# Patient Record
Sex: Female | Born: 1980 | Race: White | Hispanic: No | Marital: Single | Smoking: Former smoker
Health system: Southern US, Community
[De-identification: ages and names within clinical notes are randomized; demographics above are authoritative.]

## PROBLEM LIST (undated history)

## (undated) DIAGNOSIS — F909 Attention-deficit hyperactivity disorder, unspecified type: Secondary | ICD-10-CM

## (undated) DIAGNOSIS — F3181 Bipolar II disorder: Secondary | ICD-10-CM

## (undated) DIAGNOSIS — F419 Anxiety disorder, unspecified: Secondary | ICD-10-CM

## (undated) DIAGNOSIS — F4010 Social phobia, unspecified: Secondary | ICD-10-CM

## (undated) DIAGNOSIS — F32A Depression, unspecified: Secondary | ICD-10-CM

## (undated) DIAGNOSIS — F4310 Post-traumatic stress disorder, unspecified: Secondary | ICD-10-CM

## (undated) DIAGNOSIS — T7840XA Allergy, unspecified, initial encounter: Secondary | ICD-10-CM

## (undated) HISTORY — DX: Anxiety disorder, unspecified: F41.9

## (undated) HISTORY — DX: Allergy, unspecified, initial encounter: T78.40XA

---
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
## 2002-03-09 ENCOUNTER — Other Ambulatory Visit: Admission: RE | Admit: 2002-03-09 | Discharge: 2002-03-09 | Payer: Self-pay | Admitting: Family Medicine

## 2004-08-29 ENCOUNTER — Ambulatory Visit: Payer: Self-pay | Admitting: Family Medicine

## 2005-12-25 ENCOUNTER — Other Ambulatory Visit: Admission: RE | Admit: 2005-12-25 | Discharge: 2005-12-25 | Payer: Self-pay | Admitting: Obstetrics and Gynecology

## 2006-06-23 ENCOUNTER — Inpatient Hospital Stay (HOSPITAL_COMMUNITY): Admission: AD | Admit: 2006-06-23 | Discharge: 2006-06-26 | Payer: Self-pay | Admitting: Obstetrics and Gynecology

## 2006-08-11 ENCOUNTER — Other Ambulatory Visit: Admission: RE | Admit: 2006-08-11 | Discharge: 2006-08-11 | Payer: Self-pay | Admitting: Obstetrics and Gynecology

## 2010-07-24 NOTE — Discharge Summary (Signed)
NAMEDAMIAN, HOFSTRA                 ACCOUNT NO.:  [id number]   MEDICAL RECORD NO.:  [id number]          PATIENT TYPE:  INP   LOCATION:  9147                          FACILITY:  [facility]   PHYSICIAN:  James A. Ashley Royalty, M.D.DATE OF BIRTH:  04-25-1980   DATE OF ADMISSION:  06/23/2006  DATE OF DISCHARGE:  06/26/2006                               DISCHARGE SUMMARY   DISCHARGE DIAGNOSES:  1. Intrauterine pregnancy at 52 weeks' gestation, delivered.  2. O negative blood type.  3. Term birth living child, vertex.   OPERATIONS AND PROCEDURES:  OB delivery with episiotomy, episiorrhaphy.   CONSULTATIONS:  None.   DISCHARGE MEDICATIONS:  1. Percocet.  2. Motrin 600 mg.   HISTORY AND PHYSICAL:  This is a 30 year old primigravida 56 weeks'  gestation with O negative blood type.  The father of the baby has a  limb/girdle muscular dystrophy.  The patient was evaluated at [facility].  She presented to the office on the day of admission for  routine checkup and was complaining of contractions.  She was noted be 3-  4 cm dilated in the office, 90% effaced, -1 station, vertex  presentation.  She was transferred to maternity admissions.  For the  remainder of the history and physical, please see chart.   HOSPITAL COURSE:  The patient was admitted to [facility].  Admission laboratory studies were drawn.  She went on to  labor and deliver on June 24, 2006.  The infant was an 8-pound 4-ounces  female, Apgars 9 at one minute and 9 at five minutes, sent to newborn  nursery.  Delivery was accomplished by Dr. Sylvester Harder over a second-  degree midline episiotomy which was repaired without difficulty.  The  patient was discharged home on the second postpartum day, afebrile and  in satisfactory condition.   DISPOSITION:  The patient is to return to [facility] in 4-6 weeks for postpartum evaluation.      James A. Ashley Royalty, M.D.  Electronically  Signed     JAM/MEDQ  D:  08/06/2006  T:  08/06/2006  Job:  [id number]

## 2012-06-22 ENCOUNTER — Ambulatory Visit (INDEPENDENT_AMBULATORY_CARE_PROVIDER_SITE_OTHER): Payer: BC Managed Care – PPO | Admitting: Emergency Medicine

## 2012-06-22 VITALS — BP 130/91 | HR 88 | Temp 98.8°F | Resp 16 | Ht 69.25 in | Wt 167.0 lb

## 2012-06-22 DIAGNOSIS — S335XXA Sprain of ligaments of lumbar spine, initial encounter: Secondary | ICD-10-CM

## 2012-06-22 MED ORDER — NAPROXEN SODIUM 550 MG PO TABS: 550.0000 mg | ORAL_TABLET | Freq: Two times a day (BID) | ORAL | Status: AC

## 2012-06-22 MED ORDER — HYDROCODONE-ACETAMINOPHEN 5-325 MG PO TABS: 1.0000 | ORAL_TABLET | ORAL | Status: DC | PRN

## 2012-06-22 MED ORDER — CYCLOBENZAPRINE HCL 10 MG PO TABS: 10.0000 mg | ORAL_TABLET | Freq: Three times a day (TID) | ORAL | Status: DC | PRN

## 2012-06-22 NOTE — Progress Notes (Signed)
[facility] [address], [place] [address] [phone]  Date:  06/22/2012   Name:  Kristy Petersen   DOB:  [birth year]   MRN:  [id number]  PCP:  No primary provider on file.    Chief Complaint: Back Pain   History of Present Illness:  Kristy Petersen is a 32 y.o. very pleasant female patient who presents with the following:  Lifted her wheelchair bound spouse into a chair Saturday and developed pain in the right low back.  Non radiating. No neuro symptoms or weakness.  No history of prior injury.  No improvement with over the counter medications or other home remedies.  Denies other complaint or health concern today.   There is no problem list on file for this patient.   Past Medical History  Diagnosis Date  . Allergy   . Anxiety     History reviewed. No pertinent past surgical history.  History  Substance Use Topics  . Smoking status: Never Smoker   . Smokeless tobacco: Never Used  . Alcohol Use: No    Family History  Problem Relation Age of Onset  . Lupus Mother     No Known Allergies  Medication list has been reviewed and updated.  No current outpatient prescriptions on file prior to visit.   No current facility-administered medications on file prior to visit.    Review of Systems:  As per HPI, otherwise negative.    Physical Examination: Filed Vitals:   06/22/12 1353  BP: 130/91  Pulse: 88  Temp: 98.8 F (37.1 C)  Resp: 16   Filed Vitals:   06/22/12 1353  Height: 5' 9.25" (1.759 m)  Weight: 167 lb (75.751 kg)   Body mass index is 24.48 kg/(m^2). Ideal Body Weight: Weight in (lb) to have BMI = 25: 170.2   GEN: WDWN, NAD, Non-toxic, Alert & Oriented x 3 HEENT: Atraumatic, Normocephalic.  Ears and Nose: No external deformity. EXTR: No clubbing/cyanosis/edema NEURO: Normal gait.  PSYCH: Normally interactive. Conversant. Not depressed or anxious appearing.  Calm demeanor.  BACK:  Tender right para spinous muscles.   Motor and sensory intact   Assessment and Plan:  Lumbar strain Flexeril Anaprox vicodin Local heat Follow up in one week Signed,  Phillips Odor, MD

## 2012-06-22 NOTE — Patient Instructions (Addendum)

## 2013-11-07 ENCOUNTER — Ambulatory Visit (INDEPENDENT_AMBULATORY_CARE_PROVIDER_SITE_OTHER): Payer: BC Managed Care – PPO | Admitting: Family Medicine

## 2013-11-07 VITALS — BP 122/64 | HR 86 | Temp 97.9°F | Resp 14 | Ht 69.5 in | Wt 143.0 lb

## 2013-11-07 DIAGNOSIS — S335XXA Sprain of ligaments of lumbar spine, initial encounter: Secondary | ICD-10-CM

## 2013-11-07 MED ORDER — PREDNISONE 20 MG PO TABS: 40.0000 mg | ORAL_TABLET | Freq: Every day | ORAL | Status: DC

## 2013-11-07 MED ORDER — HYDROCODONE-ACETAMINOPHEN 5-325 MG PO TABS: 1.0000 | ORAL_TABLET | ORAL | Status: DC | PRN

## 2013-11-07 NOTE — Progress Notes (Signed)
° °  Subjective:  This chart was scribed for Elvina Sidle, MD by Arlan Organ, [facility] Scribe. This patient was seen in room 3 and the patient's care was started 12:11 PM.    Patient ID: Kristy Petersen, female    DOB: 09-Jul-1980, 33 y.o.   MRN: [id number]  Chief Complaint  Patient presents with   Back Pain    since yesterday, transferring husband from chair to bed    HPI HPI Comments: Kristy Petersen is a 33 y.o. female who presents to [facility] complaining of back pain onset yesterday. Pt states she was pivoting her husband to the bed from his power wheelchair when she pulled her back. Pain is exacerbated with certain movements. No alleviating factors at this time. She has tried ice/heat application along with OTC medications without any improvement for symptoms. At this time she denies any fever or chills. No numbness, paresthesia, or loss of sensation. She denies any urinary or bowel incontinence. No known allergies to medications. No other concerns this visit.  There are no active problems to display for this patient.  Past Medical History  Diagnosis Date   Allergy    Anxiety    History reviewed. No pertinent past surgical history. No Known Allergies Prior to Admission medications   Medication Sig Start Date End Date Taking? Authorizing Provider  cyclobenzaprine (FLEXERIL) 10 MG tablet Take 1 tablet (10 mg total) by mouth 3 (three) times daily as needed for muscle spasms. 06/22/12   Carmelina Dane, MD  HYDROcodone-acetaminophen (NORCO) 5-325 MG per tablet Take 1 tablet by mouth every 4 (four) hours as needed. 06/22/12   Carmelina Dane, MD    Review of Systems  Constitutional: Negative for fever and chills.  Musculoskeletal: Positive for back pain.  Neurological: Negative for weakness and numbness.    Triage Vitals: BP 122/64   Pulse 86   Temp(Src) 97.9 F (36.6 C) (Oral)   Resp 14   Ht 5' 9.5" (1.765 m)   Wt 143 lb (64.864 kg)   BMI  20.82 kg/m2   SpO2 99%   LMP 10/25/2013  Objective:   Physical Exam  Nursing note and vitals reviewed. Constitutional: She is oriented to person, place, and time. She appears well-developed and well-nourished.  HENT:  Head: Normocephalic.  Eyes: EOM are normal.  Neck: Normal range of motion.  Pulmonary/Chest: Effort normal.  Abdominal: She exhibits no distension.  Musculoskeletal: Normal range of motion.  Neurological: She is alert and oriented to person, place, and time.  Psychiatric: She has a normal mood and affect.    Assessment & Plan:  I personally performed the services described in this documentation, which was scribed in my presence. The recorded information has been reviewed and is accurate.    Lumbar back sprain, initial encounter - Plan: HYDROcodone-acetaminophen (NORCO) 5-325 MG per tablet, predniSONE (DELTASONE) 20 MG tablet  Signed, Elvina Sidle, MD

## 2013-11-07 NOTE — Patient Instructions (Signed)
I believe you have a strained back muscle which should gradually resolve over the next few days.  If the pain worsens, or fails to improve in 48 hours, please return

## 2013-11-18 ENCOUNTER — Ambulatory Visit (INDEPENDENT_AMBULATORY_CARE_PROVIDER_SITE_OTHER): Payer: BC Managed Care – PPO

## 2013-11-18 ENCOUNTER — Ambulatory Visit (INDEPENDENT_AMBULATORY_CARE_PROVIDER_SITE_OTHER): Payer: BC Managed Care – PPO | Admitting: Family Medicine

## 2013-11-18 VITALS — BP 122/68 | HR 104 | Temp 98.7°F | Resp 18 | Ht 68.25 in | Wt 141.0 lb

## 2013-11-18 DIAGNOSIS — M545 Low back pain, unspecified: Secondary | ICD-10-CM

## 2013-11-18 DIAGNOSIS — S335XXA Sprain of ligaments of lumbar spine, initial encounter: Secondary | ICD-10-CM

## 2013-11-18 MED ORDER — CYCLOBENZAPRINE HCL 10 MG PO TABS: 10.0000 mg | ORAL_TABLET | Freq: Three times a day (TID) | ORAL | Status: DC | PRN

## 2013-11-18 MED ORDER — MELOXICAM 15 MG PO TABS: 15.0000 mg | ORAL_TABLET | Freq: Every day | ORAL | Status: DC

## 2013-11-18 MED ORDER — HYDROCODONE-ACETAMINOPHEN 5-325 MG PO TABS: 1.0000 | ORAL_TABLET | ORAL | Status: DC | PRN

## 2013-11-18 NOTE — Patient Instructions (Signed)
Lumbosacral Strain Lumbosacral strain is a strain of any of the parts that make up your lumbosacral vertebrae. Your lumbosacral vertebrae are the bones that make up the lower third of your backbone. Your lumbosacral vertebrae are held together by muscles and tough, fibrous tissue (ligaments).  CAUSES  A sudden blow to your back can cause lumbosacral strain. Also, anything that causes an excessive stretch of the muscles in the low back can cause this strain. This is typically seen when people exert themselves strenuously, fall, lift heavy objects, bend, or crouch repeatedly. RISK FACTORS  Physically demanding work.  Participation in pushing or pulling sports or sports that require a sudden twist of the back (tennis, golf, baseball).  Weight lifting.  Excessive lower back curvature.  Forward-tilted pelvis.  Weak back or abdominal muscles or both.  Tight hamstrings. SIGNS AND SYMPTOMS  Lumbosacral strain may cause pain in the area of your injury or pain that moves (radiates) down your leg.  DIAGNOSIS Your health care provider can often diagnose lumbosacral strain through a physical exam. In some cases, you may need tests such as X-ray exams.  TREATMENT  Treatment for your lower back injury depends on many factors that your clinician will have to evaluate. However, most treatment will include the use of anti-inflammatory medicines. HOME CARE INSTRUCTIONS   Avoid hard physical activities (tennis, racquetball, waterskiing) if you are not in proper physical condition for it. This may aggravate or create problems.  If you have a back problem, avoid sports requiring sudden body movements. Swimming and walking are generally safer activities.  Maintain good posture.  Maintain a healthy weight.  For acute conditions, you may put ice on the injured area.  Put ice in a plastic bag.  Place a towel between your skin and the bag.  Leave the ice on for 20 minutes, 2-3 times a day.  When the  low back starts healing, stretching and strengthening exercises may be recommended. SEEK MEDICAL CARE IF:  Your back pain is getting worse.  You experience severe back pain not relieved with medicines. SEEK IMMEDIATE MEDICAL CARE IF:   You have numbness, tingling, weakness, or problems with the use of your arms or legs.  There is a change in bowel or bladder control.  You have increasing pain in any area of the body, including your belly (abdomen).  You notice shortness of breath, dizziness, or feel faint.  You feel sick to your stomach (nauseous), are throwing up (vomiting), or become sweaty.  You notice discoloration of your toes or legs, or your feet get very cold. MAKE SURE YOU:   Understand these instructions.  Will watch your condition.  Will get help right away if you are not doing well or get worse. Document Released: 12/05/2004 Document Revised: 03/02/2013 Document Reviewed: 10/14/2012 [facility] Patient Information 2015 [place], [place]. This information is not intended to replace advice given to you by your health care provider. Make sure you discuss any questions you have with your health care provider. Low Back Strain with Rehab A strain is an injury in which a tendon or muscle is torn. The muscles and tendons of the lower back are vulnerable to strains. However, these muscles and tendons are very strong and require a great force to be injured. Strains are classified into three categories. Grade 1 strains cause pain, but the tendon is not lengthened. Grade 2 strains include a lengthened ligament, due to the ligament being stretched or partially ruptured. With grade 2 strains there is still  function, although the function may be decreased. Grade 3 strains involve a complete tear of the tendon or muscle, and function is usually impaired. SYMPTOMS   Pain in the lower back.  Pain that affects one side more than the other.  Pain that gets worse with movement and may be felt  in the hip, buttocks, or back of the thigh.  Muscle spasms of the muscles in the back.  Swelling along the muscles of the back.  Loss of strength of the back muscles.  Crackling sound (crepitation) when the muscles are touched. CAUSES  Lower back strains occur when a force is placed on the muscles or tendons that is greater than they can handle. Common causes of injury include:  Prolonged overuse of the muscle-tendon units in the lower back, usually from incorrect posture.  A single violent injury or force applied to the back. RISK INCREASES WITH:  Sports that involve twisting forces on the spine or a lot of bending at the waist (football, rugby, weightlifting, bowling, golf, tennis, speed skating, racquetball, swimming, running, gymnastics, diving).  Poor strength and flexibility.  Failure to warm up properly before activity.  Family history of lower back pain or disk disorders.  Previous back injury or surgery (especially fusion).  Poor posture with lifting, especially heavy objects.  Prolonged sitting, especially with poor posture. PREVENTION   Learn and use proper posture when sitting or lifting (maintain proper posture when sitting, lift using the knees and legs, not at the waist).  Warm up and stretch properly before activity.  Allow for adequate recovery between workouts.  Maintain physical fitness:  Strength, flexibility, and endurance.  Cardiovascular fitness. PROGNOSIS  If treated properly, lower back strains usually heal within 6 weeks. RELATED COMPLICATIONS   Recurring symptoms, resulting in a chronic problem.  Chronic inflammation, scarring, and partial muscle-tendon tear.  Delayed healing or resolution of symptoms.  Prolonged disability. TREATMENT  Treatment first involves the use of ice and medicine, to reduce pain and inflammation. The use of strengthening and stretching exercises may help reduce pain with activity. These exercises may be  performed at home or with a therapist. Severe injuries may require referral to a therapist for further evaluation and treatment, such as ultrasound. Your caregiver may advise that you wear a back brace or corset, to help reduce pain and discomfort. Often, prolonged bed rest results in greater harm then benefit. Corticosteroid injections may be recommended. However, these should be reserved for the most serious cases. It is important to avoid using your back when lifting objects. At night, sleep on your back on a firm mattress with a pillow placed under your knees. If non-surgical treatment is unsuccessful, surgery may be needed.  MEDICATION   If pain medicine is needed, nonsteroidal anti-inflammatory medicines (aspirin and ibuprofen), or other minor pain relievers (acetaminophen), are often advised.  Do not take pain medicine for 7 days before surgery.  Prescription pain relievers may be given, if your caregiver thinks they are needed. Use only as directed and only as much as you need.  Ointments applied to the skin may be helpful.  Corticosteroid injections may be given by your caregiver. These injections should be reserved for the most serious cases, because they may only be given a certain number of times. HEAT AND COLD  Cold treatment (icing) should be applied for 10 to 15 minutes every 2 to 3 hours for inflammation and pain, and immediately after activity that aggravates your symptoms. Use ice packs or an ice  massage.  Heat treatment may be used before performing stretching and strengthening activities prescribed by your caregiver, physical therapist, or athletic trainer. Use a heat pack or a warm water soak. SEEK MEDICAL CARE IF:   Symptoms get worse or do not improve in 2 to 4 weeks, despite treatment.  You develop numbness, weakness, or loss of bowel or bladder function.  New, unexplained symptoms develop. (Drugs used in treatment may produce side effects.) EXERCISES  RANGE OF MOTION  (ROM) AND STRETCHING EXERCISES - Low Back Strain Most people with lower back pain will find that their symptoms get worse with excessive bending forward (flexion) or arching at the lower back (extension). The exercises which will help resolve your symptoms will focus on the opposite motion.  Your physician, physical therapist or athletic trainer will help you determine which exercises will be most helpful to resolve your lower back pain. Do not complete any exercises without first consulting with your caregiver. Discontinue any exercises which make your symptoms worse until you speak to your caregiver.  If you have pain, numbness or tingling which travels down into your buttocks, leg or foot, the goal of the therapy is for these symptoms to move closer to your back and eventually resolve. Sometimes, these leg symptoms will get better, but your lower back pain may worsen. This is typically an indication of progress in your rehabilitation. Be very alert to any changes in your symptoms and the activities in which you participated in the 24 hours prior to the change. Sharing this information with your caregiver will allow him/her to most efficiently treat your condition.  These exercises may help you when beginning to rehabilitate your injury. Your symptoms may resolve with or without further involvement from your physician, physical therapist or athletic trainer. While completing these exercises, remember:  Restoring tissue flexibility helps normal motion to return to the joints. This allows healthier, less painful movement and activity.  An effective stretch should be held for at least 30 seconds.  A stretch should never be painful. You should only feel a gentle lengthening or release in the stretched tissue. FLEXION RANGE OF MOTION AND STRETCHING EXERCISES: STRETCH - Flexion, Single Knee to Chest   Lie on a firm bed or floor with both legs extended in front of you.  Keeping one leg in contact with  the floor, bring your opposite knee to your chest. Hold your leg in place by either grabbing behind your thigh or at your knee.  Pull until you feel a gentle stretch in your lower back. Hold __________ seconds.  Slowly release your grasp and repeat the exercise with the opposite side. Repeat __________ times. Complete this exercise __________ times per day.  STRETCH - Flexion, Double Knee to Chest   Lie on a firm bed or floor with both legs extended in front of you.  Keeping one leg in contact with the floor, bring your opposite knee to your chest.  Tense your stomach muscles to support your back and then lift your other knee to your chest. Hold your legs in place by either grabbing behind your thighs or at your knees.  Pull both knees toward your chest until you feel a gentle stretch in your lower back. Hold __________ seconds.  Tense your stomach muscles and slowly return one leg at a time to the floor. Repeat __________ times. Complete this exercise __________ times per day.  STRETCH - Low Trunk Rotation  Lie on a firm bed or floor. Keeping your legs  in front of you, bend your knees so they are both pointed toward the ceiling and your feet are flat on the floor.  Extend your arms out to the side. This will stabilize your upper body by keeping your shoulders in contact with the floor.  Gently and slowly drop both knees together to one side until you feel a gentle stretch in your lower back. Hold for __________ seconds.  Tense your stomach muscles to support your lower back as you bring your knees back to the starting position. Repeat the exercise to the other side. Repeat __________ times. Complete this exercise __________ times per day  EXTENSION RANGE OF MOTION AND FLEXIBILITY EXERCISES: STRETCH - Extension, Prone on Elbows   Lie on your stomach on the floor, a bed will be too soft. Place your palms about shoulder width apart and at the height of your head.  Place your elbows  under your shoulders. If this is too painful, stack pillows under your chest.  Allow your body to relax so that your hips drop lower and make contact more completely with the floor.  Hold this position for __________ seconds.  Slowly return to lying flat on the floor. Repeat __________ times. Complete this exercise __________ times per day.  RANGE OF MOTION - Extension, Prone Press Ups  Lie on your stomach on the floor, a bed will be too soft. Place your palms about shoulder width apart and at the height of your head.  Keeping your back as relaxed as possible, slowly straighten your elbows while keeping your hips on the floor. You may adjust the placement of your hands to maximize your comfort. As you gain motion, your hands will come more underneath your shoulders.  Hold this position __________ seconds.  Slowly return to lying flat on the floor. Repeat __________ times. Complete this exercise __________ times per day.  RANGE OF MOTION- Quadruped, Neutral Spine   Assume a hands and knees position on a firm surface. Keep your hands under your shoulders and your knees under your hips. You may place padding under your knees for comfort.  Drop your head and point your tail bone toward the ground below you. This will round out your lower back like an angry cat. Hold this position for __________ seconds.  Slowly lift your head and release your tail bone so that your back sags into a large arch, like an old horse.  Hold this position for __________ seconds.  Repeat this until you feel limber in your lower back.  Now, find your "sweet spot." This will be the most comfortable position somewhere between the two previous positions. This is your neutral spine. Once you have found this position, tense your stomach muscles to support your lower back.  Hold this position for __________ seconds. Repeat __________ times. Complete this exercise __________ times per day.  STRENGTHENING EXERCISES -  Low Back Strain These exercises may help you when beginning to rehabilitate your injury. These exercises should be done near your "sweet spot." This is the neutral, low-back arch, somewhere between fully rounded and fully arched, that is your least painful position. When performed in this safe range of motion, these exercises can be used for people who have either a flexion or extension based injury. These exercises may resolve your symptoms with or without further involvement from your physician, physical therapist or athletic trainer. While completing these exercises, remember:   Muscles can gain both the endurance and the strength needed for everyday activities through controlled  exercises.  Complete these exercises as instructed by your physician, physical therapist or athletic trainer. Increase the resistance and repetitions only as guided.  You may experience muscle soreness or fatigue, but the pain or discomfort you are trying to eliminate should never worsen during these exercises. If this pain does worsen, stop and make certain you are following the directions exactly. If the pain is still present after adjustments, discontinue the exercise until you can discuss the trouble with your caregiver. STRENGTHENING - Deep Abdominals, Pelvic Tilt  Lie on a firm bed or floor. Keeping your legs in front of you, bend your knees so they are both pointed toward the ceiling and your feet are flat on the floor.  Tense your lower abdominal muscles to press your lower back into the floor. This motion will rotate your pelvis so that your tail bone is scooping upwards rather than pointing at your feet or into the floor.  With a gentle tension and even breathing, hold this position for __________ seconds. Repeat __________ times. Complete this exercise __________ times per day.  STRENGTHENING - Abdominals, Crunches   Lie on a firm bed or floor. Keeping your legs in front of you, bend your knees so they are  both pointed toward the ceiling and your feet are flat on the floor. Cross your arms over your chest.  Slightly tip your chin down without bending your neck.  Tense your abdominals and slowly lift your trunk high enough to just clear your shoulder blades. Lifting higher can put excessive stress on the lower back and does not further strengthen your abdominal muscles.  Control your return to the starting position. Repeat __________ times. Complete this exercise __________ times per day.  STRENGTHENING - Quadruped, Opposite UE/LE Lift   Assume a hands and knees position on a firm surface. Keep your hands under your shoulders and your knees under your hips. You may place padding under your knees for comfort.  Find your neutral spine and gently tense your abdominal muscles so that you can maintain this position. Your shoulders and hips should form a rectangle that is parallel with the floor and is not twisted.  Keeping your trunk steady, lift your right hand no higher than your shoulder and then your left leg no higher than your hip. Make sure you are not holding your breath. Hold this position __________ seconds.  Continuing to keep your abdominal muscles tense and your back steady, slowly return to your starting position. Repeat with the opposite arm and leg. Repeat __________ times. Complete this exercise __________ times per day.  STRENGTHENING - Lower Abdominals, Double Knee Lift  Lie on a firm bed or floor. Keeping your legs in front of you, bend your knees so they are both pointed toward the ceiling and your feet are flat on the floor.  Tense your abdominal muscles to brace your lower back and slowly lift both of your knees until they come over your hips. Be certain not to hold your breath.  Hold __________ seconds. Using your abdominal muscles, return to the starting position in a slow and controlled manner. Repeat __________ times. Complete this exercise __________ times per day.    POSTURE AND BODY MECHANICS CONSIDERATIONS - Low Back Strain Keeping correct posture when sitting, standing or completing your activities will reduce the stress put on different body tissues, allowing injured tissues a chance to heal and limiting painful experiences. The following are general guidelines for improved posture. Your physician or physical therapist will provide  you with any instructions specific to your needs. While reading these guidelines, remember:  The exercises prescribed by your provider will help you have the flexibility and strength to maintain correct postures.  The correct posture provides the best environment for your joints to work. All of your joints have less wear and tear when properly supported by a spine with good posture. This means you will experience a healthier, less painful body.  Correct posture must be practiced with all of your activities, especially prolonged sitting and standing. Correct posture is as important when doing repetitive low-stress activities (typing) as it is when doing a single heavy-load activity (lifting). RESTING POSITIONS Consider which positions are most painful for you when choosing a resting position. If you have pain with flexion-based activities (sitting, bending, stooping, squatting), choose a position that allows you to rest in a less flexed posture. You would want to avoid curling into a fetal position on your side. If your pain worsens with extension-based activities (prolonged standing, working overhead), avoid resting in an extended position such as sleeping on your stomach. Most people will find more comfort when they rest with their spine in a more neutral position, neither too rounded nor too arched. Lying on a non-sagging bed on your side with a pillow between your knees, or on your back with a pillow under your knees will often provide some relief. Keep in mind, being in any one position for a prolonged period of time, no matter how  correct your posture, can still lead to stiffness. PROPER SITTING POSTURE In order to minimize stress and discomfort on your spine, you must sit with correct posture. Sitting with good posture should be effortless for a healthy body. Returning to good posture is a gradual process. Many people can work toward this most comfortably by using various supports until they have the flexibility and strength to maintain this posture on their own. When sitting with proper posture, your ears will fall over your shoulders and your shoulders will fall over your hips. You should use the back of the chair to support your upper back. Your lower back will be in a neutral position, just slightly arched. You may place a small pillow or folded towel at the base of your lower back for support.  When working at a desk, create an environment that supports good, upright posture. Without extra support, muscles tire, which leads to excessive strain on joints and other tissues. Keep these recommendations in mind: CHAIR:  A chair should be able to slide under your desk when your back makes contact with the back of the chair. This allows you to work closely.  The chair's height should allow your eyes to be level with the upper part of your monitor and your hands to be slightly lower than your elbows. BODY POSITION  Your feet should make contact with the floor. If this is not possible, use a foot rest.  Keep your ears over your shoulders. This will reduce stress on your neck and lower back. INCORRECT SITTING POSTURES  If you are feeling tired and unable to assume a healthy sitting posture, do not slouch or slump. This puts excessive strain on your back tissues, causing more damage and pain. Healthier options include:  Using more support, like a lumbar pillow.  Switching tasks to something that requires you to be upright or walking.  Talking a brief walk.  Lying down to rest in a neutral-spine position. PROLONGED STANDING  WHILE SLIGHTLY LEANING FORWARD  When  completing a task that requires you to lean forward while standing in one place for a long time, place either foot up on a stationary 2-4 inch high object to help maintain the best posture. When both feet are on the ground, the lower back tends to lose its slight inward curve. If this curve flattens (or becomes too large), then the back and your other joints will experience too much stress, tire more quickly, and can cause pain. CORRECT STANDING POSTURES Proper standing posture should be assumed with all daily activities, even if they only take a few moments, like when brushing your teeth. As in sitting, your ears should fall over your shoulders and your shoulders should fall over your hips. You should keep a slight tension in your abdominal muscles to brace your spine. Your tailbone should point down to the ground, not behind your body, resulting in an over-extended swayback posture.  INCORRECT STANDING POSTURES  Common incorrect standing postures include a forward head, locked knees and/or an excessive swayback. WALKING Walk with an upright posture. Your ears, shoulders and hips should all line-up. PROLONGED ACTIVITY IN A FLEXED POSITION When completing a task that requires you to bend forward at your waist or lean over a low surface, try to find a way to stabilize 3 out of 4 of your limbs. You can place a hand or elbow on your thigh or rest a knee on the surface you are reaching across. This will provide you more stability so that your muscles do not fatigue as quickly. By keeping your knees relaxed, or slightly bent, you will also reduce stress across your lower back. CORRECT LIFTING TECHNIQUES DO :   Assume a wide stance. This will provide you more stability and the opportunity to get as close as possible to the object which you are lifting.  Tense your abdominals to brace your spine. Bend at the knees and hips. Keeping your back locked in a neutral-spine  position, lift using your leg muscles. Lift with your legs, keeping your back straight.  Test the weight of unknown objects before attempting to lift them.  Try to keep your elbows locked down at your sides in order get the best strength from your shoulders when carrying an object.  Always ask for help when lifting heavy or awkward objects. INCORRECT LIFTING TECHNIQUES DO NOT:   Lock your knees when lifting, even if it is a small object.  Bend and twist. Pivot at your feet or move your feet when needing to change directions.  Assume that you can safely pick up even a paper clip without proper posture. Document Released: 02/25/2005 Document Revised: 05/20/2011 Document Reviewed: 06/09/2008 [facility] Patient Information 2015 [place], [place]. This information is not intended to replace advice given to you by your health care provider. Make sure you discuss any questions you have with your health care provider.

## 2013-11-18 NOTE — Progress Notes (Signed)
Subjective:  This chart was scribed for Norberto Sorenson, MD, by Yevette Edwards, ED Scribe. This patient's care was started at 2:00 PM.   Patient ID: Kristy Petersen, female    DOB: 09-Feb-1981, 33 y.o.   MRN: [id number]  Chief Complaint  Patient presents with  . Follow-up    LBP    HPI  Kristy Petersen is a 33 y.o. female who presents to [facility] for a follow-up concerning worsening lumbar pain. She reports onset of the pain a week ago; she was treated here by Dr. Milus Glazier following onset of the pain, and she was prescribed prednisone and Vicodin. She states the Vicodin improved the pain, but there does not seem to be relief with prednisone. Kristy Petersen reports the pain was originally to lumbar midline, but it now radiates across the midline bilaterally. She denies numbness or paresthesia to her extremities. She also denies "shooting" pain down her leg, urinary or bowel incontinence, fever, or chills. She reports a h/o similar symptoms last year which resolved spontaneously.   Kristy Petersen states her husband has muscular dystrophy, and he has a new wheelchair which is three inches taller than the previous wheelchair. She believes the pulling and pushing which is required by the new wheelchair may have catalyzed the back pain. She states transfers from the wheelchair to the bed, toilet, and shower are very difficult.   Kristy Petersen' mother lives near her, and her mother helps with her with her son. Her husband receives physical therapy through [facility].   Past Medical History  Diagnosis Date  . Allergy   . Anxiety    Current Outpatient Prescriptions on File Prior to Visit  Medication Sig Dispense Refill  . cyclobenzaprine (FLEXERIL) 10 MG tablet Take 1 tablet (10 mg total) by mouth 3 (three) times daily as needed for muscle spasms.  30 tablet  0  . HYDROcodone-acetaminophen (NORCO) 5-325 MG per tablet Take 1 tablet by mouth every 4 (four) hours as needed.  30 tablet  0  . predniSONE (DELTASONE) 20 MG tablet  Take 2 tablets (40 mg total) by mouth daily.  10 tablet  0   No current facility-administered medications on file prior to visit.   No Known Allergies   Review of Systems  Constitutional: Negative for fever and chills.  Genitourinary: Negative for urgency.  Musculoskeletal: Positive for back pain.  Neurological: Negative for weakness and numbness.   Vitals: BP 122/68  Pulse 104  Temp(Src) 98.7 F (37.1 C) (Oral)  Resp 18  Ht 5' 8.25" (1.734 m)  Wt 141 lb (63.957 kg)  BMI 21.27 kg/m2  SpO2 99%  LMP 10/25/2013    Objective:   Physical Exam  Nursing note and vitals reviewed. Constitutional: She is oriented to person, place, and time. She appears well-developed and well-nourished. No distress.  HENT:  Head: Normocephalic and atraumatic.  Eyes: Conjunctivae and EOM are normal.  Neck: Neck supple. No tracheal deviation present.  Cardiovascular: Normal rate.   Pulmonary/Chest: Effort normal. No respiratory distress.  Musculoskeletal: Normal range of motion.       Right shoulder: She exhibits tenderness and spasm.  Loss of normal lordosis.  Point tenderness over L2 and upper lumbar.  Tenderness around left lower paraspinal and right lumbar paraspinal with palpable muscle spasms.  Negative straight leg raise bilaterally.   Neurological: She is alert and oriented to person, place, and time.  Reflex Scores:      Patellar reflexes are 2+ on the right side and 2+ on  the left side.      Achilles reflexes are 2+ on the right side and 2+ on the left side. Strength: 5/5 in all lower extremities, 4/5 in hip flexors due to pain.   Skin: Skin is warm and dry.  Psychiatric: She has a normal mood and affect. Her behavior is normal.   [facility] reading (PRIMARY) by  Dr. Norberto Sorenson: Lumbar imaging: disc spaces appear well-preserved. No acute abnormality.      Assessment & Plan:  Refer pt to to PT with [facility] since she will likely have repeated lifting injuries since husband is  non-ambulatory with muscular dystrophy.  Will prescribe pt with Meloxicam to use daily.  Will also prescribe Norco as well as Flexeril. Advised pt to use Flexeril in the evening prior to sleep.  Encouraged pt to use heat 20 mins four times a day. Encouraged pt to stretch following the application of heat.  Bilateral low back pain without sciatica - Plan: DG Lumbar Spine Complete, Ambulatory referral to Physical Therapy  Lumbar back sprain, initial encounter - Plan: HYDROcodone-acetaminophen (NORCO) 5-325 MG per tablet, Ambulatory referral to Physical Therapy  Meds ordered this encounter  Medications  . cyclobenzaprine (FLEXERIL) 10 MG tablet    Sig: Take 1 tablet (10 mg total) by mouth 3 (three) times daily as needed for muscle spasms.    Dispense:  30 tablet    Refill:  1  . HYDROcodone-acetaminophen (NORCO) 5-325 MG per tablet    Sig: Take 1 tablet by mouth every 4 (four) hours as needed.    Dispense:  30 tablet    Refill:  0  . meloxicam (MOBIC) 15 MG tablet    Sig: Take 1 tablet (15 mg total) by mouth daily.    Dispense:  30 tablet    Refill:  1    I personally performed the services described in this documentation, which was scribed in my presence. The recorded information has been reviewed and considered, and addended by me as needed.  Norberto Sorenson, MD MPH

## 2014-03-31 ENCOUNTER — Ambulatory Visit (INDEPENDENT_AMBULATORY_CARE_PROVIDER_SITE_OTHER): Payer: BLUE CROSS/BLUE SHIELD | Admitting: Sports Medicine

## 2014-03-31 VITALS — BP 102/70 | HR 92 | Temp 98.7°F | Resp 18 | Ht 69.0 in | Wt 139.0 lb

## 2014-03-31 DIAGNOSIS — R112 Nausea with vomiting, unspecified: Secondary | ICD-10-CM

## 2014-03-31 DIAGNOSIS — K219 Gastro-esophageal reflux disease without esophagitis: Secondary | ICD-10-CM

## 2014-03-31 LAB — POCT UA - MICROSCOPIC ONLY
CASTS, UR, LPF, POC: NEGATIVE
CRYSTALS, UR, HPF, POC: NEGATIVE
Mucus, UA: NEGATIVE
YEAST UA: NEGATIVE

## 2014-03-31 LAB — POCT CBC
Granulocyte percent: 62.7 %G (ref 37–80)
HEMATOCRIT: 40.3 % (ref 37.7–47.9)
Hemoglobin: 13 g/dL (ref 12.2–16.2)
LYMPH, POC: 3.1 (ref 0.6–3.4)
MCH: 31.5 pg — AB (ref 27–31.2)
MCHC: 32.2 g/dL (ref 31.8–35.4)
MCV: 97.9 fL — AB (ref 80–97)
MID (cbc): 0.2 (ref 0–0.9)
MPV: 7.1 fL (ref 0–99.8)
PLATELET COUNT, POC: 416 10*3/uL (ref 142–424)
POC Granulocyte: 5.6 (ref 2–6.9)
POC LYMPH PERCENT: 34.8 %L (ref 10–50)
POC MID %: 2.5 %M (ref 0–12)
RBC: 4.11 M/uL (ref 4.04–5.48)
RDW, POC: 12.8 %
WBC: 8.9 10*3/uL (ref 4.6–10.2)

## 2014-03-31 LAB — POCT URINALYSIS DIPSTICK
Bilirubin, UA: NEGATIVE
GLUCOSE UA: NEGATIVE
Ketones, UA: NEGATIVE
Leukocytes, UA: NEGATIVE
Nitrite, UA: NEGATIVE
PROTEIN UA: NEGATIVE
RBC UA: NEGATIVE
Spec Grav, UA: 1.01
Urobilinogen, UA: 0.2
pH, UA: 7

## 2014-03-31 LAB — POCT URINE PREGNANCY: Preg Test, Ur: NEGATIVE

## 2014-03-31 MED ORDER — PANTOPRAZOLE SODIUM 40 MG PO TBEC: 40.0000 mg | DELAYED_RELEASE_TABLET | Freq: Every day | ORAL | Status: DC

## 2014-03-31 MED ORDER — ONDANSETRON 4 MG PO TBDP: 4.0000 mg | ORAL_TABLET | Freq: Four times a day (QID) | ORAL | Status: DC | PRN

## 2014-03-31 NOTE — Patient Instructions (Signed)
Gastroesophageal Reflux Disease, Adult Gastroesophageal reflux disease (GERD) happens when acid from your stomach flows up into the esophagus. When acid comes in contact with the esophagus, the acid causes soreness (inflammation) in the esophagus. Over time, GERD may create small holes (ulcers) in the lining of the esophagus. CAUSES   Increased body weight. This puts pressure on the stomach, making acid rise from the stomach into the esophagus.  Smoking. This increases acid production in the stomach.  Drinking alcohol. This causes decreased pressure in the lower esophageal sphincter (valve or ring of muscle between the esophagus and stomach), allowing acid from the stomach into the esophagus.  Late evening meals and a full stomach. This increases pressure and acid production in the stomach.  A malformed lower esophageal sphincter. Sometimes, no cause is found. SYMPTOMS   Burning pain in the lower part of the mid-chest behind the breastbone and in the mid-stomach area. This may occur twice a week or more often.  Trouble swallowing.  Sore throat.  Dry cough.  Asthma-like symptoms including chest tightness, shortness of breath, or wheezing. DIAGNOSIS  Your caregiver may be able to diagnose GERD based on your symptoms. In some cases, X-rays and other tests may be done to check for complications or to check the condition of your stomach and esophagus. TREATMENT  Your caregiver may recommend over-the-counter or prescription medicines to help decrease acid production. Ask your caregiver before starting or adding any new medicines.  HOME CARE INSTRUCTIONS   Change the factors that you can control. Ask your caregiver for guidance concerning weight loss, quitting smoking, and alcohol consumption.  Avoid foods and drinks that make your symptoms worse, such as:  Caffeine or alcoholic drinks.  Chocolate.  Peppermint or mint flavorings.  Garlic and onions.  Spicy foods.  Citrus fruits,  such as oranges, lemons, or limes.  Tomato-based foods such as sauce, chili, salsa, and pizza.  Fried and fatty foods.  Avoid lying down for the 3 hours prior to your bedtime or prior to taking a nap.  Eat small, frequent meals instead of large meals.  Wear loose-fitting clothing. Do not wear anything tight around your waist that causes pressure on your stomach.  Raise the head of your bed 6 to 8 inches with wood blocks to help you sleep. Extra pillows will not help.  Only take over-the-counter or prescription medicines for pain, discomfort, or fever as directed by your caregiver.  Do not take aspirin, ibuprofen, or other nonsteroidal anti-inflammatory drugs (NSAIDs). SEEK IMMEDIATE MEDICAL CARE IF:   You have pain in your arms, neck, jaw, teeth, or back.  Your pain increases or changes in intensity or duration.  You develop nausea, vomiting, or sweating (diaphoresis).  You develop shortness of breath, or you faint.  Your vomit is green, yellow, black, or looks like coffee grounds or blood.  Your stool is red, bloody, or black. These symptoms could be signs of other problems, such as heart disease, gastric bleeding, or esophageal bleeding. MAKE SURE YOU:   Understand these instructions.  Will watch your condition.  Will get help right away if you are not doing well or get worse. Document Released: 12/05/2004 Document Revised: 05/20/2011 Document Reviewed: 09/14/2010 ExitCare Patient Information 2015 [employer]. This information is not intended to replace advice given to you by your health care provider. Make sure you discuss any questions you have with your health care provider.  

## 2014-03-31 NOTE — Progress Notes (Signed)
Kristy Petersen - 34 y.o. female MRN [id number]  Date of birth: February 07, 1981  CC & HPI:  Chief Complaint  Patient presents with  . Nausea    x2 weeks off and on   . Emesis    twice     Kristy Petersen is here for evaluation of one month of nausea and vomiting. She reports this is worsened over the past 2 weeks. She initially experienced an acute foodborne illness following ingestion of oysters at Christmas time. Since that time she has had persistent intermittent episodes of acute onset nausea with vomiting. She has been typically experiencing these symptoms mainly at night while laying down. He will awaken her from sleep and cause her to have voluminous nonbloody, nonbilious vomiting.  She is only had this occur one other time it was when she was previously pregnant. She reports normal menstrual periods with her last menstrual period being less than 1 week ago and it was normal. She has taken a home pregnancy test yesterday and it was negative. The most recent episode of her vomiting occurred last night. She is taking Dramamine without significant improvement.  ROS:  Per HPI.   HISTORY: Past Medical, Surgical, Social, and Family History Reviewed & Updated per EMR.  Pertinent Historical Findings include: History of allergic rhinitis and anxiety. Former smoker currently nonsmoker   OBJECTIVE:  VS:   HT:5\' 9"  (175.3 cm)   WT:139 lb (63.05 kg)  BMI:20.6          BP:102/70 mmHg  HR:92bpm  TEMP:98.7 F (37.1 C)(Oral)  RESP:99 %  Physical Exam  Constitutional: She is oriented to person, place, and time and well-developed, well-nourished, and in no distress. No distress.  HENT:  Head: Normocephalic and atraumatic.  Right Ear: Tympanic membrane, external ear and ear canal normal.  Left Ear: Tympanic membrane, external ear and ear canal normal.  Mouth/Throat: Uvula is midline. No oral lesions. No uvula swelling. No oropharyngeal exudate, posterior oropharyngeal edema or posterior oropharyngeal erythema.   Eyes: Conjunctivae and EOM are normal. Pupils are equal, round, and reactive to light. Right eye exhibits no discharge. Left eye exhibits no discharge. No scleral icterus.  Neck: Normal range of motion. Neck supple. No JVD present. No tracheal deviation present. No thyromegaly present.  Cardiovascular: Normal rate, regular rhythm and intact distal pulses.  Exam reveals no gallop and no friction rub.   No murmur heard. Pulmonary/Chest: Effort normal and breath sounds normal. No respiratory distress. She has no wheezes. She has no rales. She exhibits no tenderness.  Abdominal: Soft. Bowel sounds are normal. She exhibits no distension and no mass. There is no tenderness. There is no rebound and no guarding.  Musculoskeletal: She exhibits no edema or tenderness.  Lymphadenopathy:    She has no cervical adenopathy.  Neurological: She is alert and oriented to person, place, and time. She has normal reflexes. No cranial nerve deficit. Gait normal. Coordination normal.  Skin: Skin is warm and dry. No rash noted. She is not diaphoretic. No erythema. No pallor.  Psychiatric: Mood, memory, affect and judgment normal.   Results for orders placed or performed in visit on 03/31/14  POCT urinalysis dipstick  Result Value Ref Range   Color, UA yellow    Clarity, UA clear    Glucose, UA neg    Bilirubin, UA neg    Ketones, UA neg    Spec Grav, UA 1.010    Blood, UA neg    pH, UA 7.0    Protein, UA neg  Urobilinogen, UA 0.2    Nitrite, UA neg    Leukocytes, UA Negative   POCT CBC  Result Value Ref Range   WBC 8.9 4.6 - 10.2 K/uL   Lymph, poc 3.1 0.6 - 3.4   POC LYMPH PERCENT 34.8 10 - 50 %L   MID (cbc) 0.2 0 - 0.9   POC MID % 2.5 0 - 12 %M   POC Granulocyte 5.6 2 - 6.9   Granulocyte percent 62.7 37 - 80 %G   RBC 4.11 4.04 - 5.48 M/uL   Hemoglobin 13.0 12.2 - 16.2 g/dL   HCT, POC 40.940.3 81.137.7 - 47.9 %   MCV 97.9 (A) 80 - 97 fL   MCH, POC 31.5 (A) 27 - 31.2 pg   MCHC 32.2 31.8 - 35.4 g/dL    RDW, POC 91.412.8 %   Platelet Count, POC 416 142 - 424 K/uL   MPV 7.1 0 - 99.8 fL  POCT urine pregnancy  Result Value Ref Range   Preg Test, Ur Negative   POCT UA - Microscopic Only  Result Value Ref Range   WBC, Ur, HPF, POC 0-1    RBC, urine, microscopic 0-1    Bacteria, U Microscopic trace    Mucus, UA neg    Epithelial cells, urine per micros 0-2    Crystals, Ur, HPF, POC neg    Casts, Ur, LPF, POC neg    Yeast, UA neg      ASSESSMENT: 1. Non-intractable vomiting with nausea, vomiting of unspecified type   2. Gastroesophageal reflux disease, esophagitis presence not specified    Symptoms not 100% consistent with single entity. Given supine exacerbations seems plausible that she is having lower esophageal sphincter dysfunction following acute foodborne illness. We will treat with 2 months of PPI therapy and provided Zofran.  Also discussed that pregnancy cannot be 100% ruled out in the first 2 weeks but this seems less consistent and would be atypical given N/V usually occurs later in 1st trimester.  Recommend repeat UPreg if not improving   PLAN: See problem based charting & AVS for additional documentation.  PPI X 2-3 months   Zofran > No Follow-up on file.

## 2014-10-20 ENCOUNTER — Ambulatory Visit (INDEPENDENT_AMBULATORY_CARE_PROVIDER_SITE_OTHER): Payer: BLUE CROSS/BLUE SHIELD | Admitting: Physician Assistant

## 2014-10-20 VITALS — BP 94/62 | HR 69 | Temp 98.8°F | Resp 14 | Ht 69.0 in | Wt 133.0 lb

## 2014-10-20 DIAGNOSIS — Z0000 Encounter for general adult medical examination without abnormal findings: Secondary | ICD-10-CM | POA: Diagnosis not present

## 2014-10-20 DIAGNOSIS — Z23 Encounter for immunization: Secondary | ICD-10-CM | POA: Diagnosis not present

## 2014-10-20 DIAGNOSIS — Z7689 Persons encountering health services in other specified circumstances: Secondary | ICD-10-CM

## 2014-10-20 NOTE — Progress Notes (Signed)
   10/20/2014 at 2:01 PM  Kristy Petersen / DOB: 14-Nov-1980 / MRN: [id number]  The patient  does not have a problem list on file.  SUBJECTIVE  Kristy Petersen is a 34 y.o. well appearing female presenting for the chief complaint of immunization review for college at [employer] where she plans to study biology and eventually health science. She would like a Hep B vaccination today if she is not immune to this.       She  has a past medical history of Allergy and Anxiety.    Medications reviewed and updated by myself where necessary, and exist elsewhere in the encounter.   Kristy Petersen has No Known Allergies. She  reports that she has quit smoking. She has never used smokeless tobacco. She reports that she does not drink alcohol or use illicit drugs. She  has no sexual activity history on file. The patient  has no past surgical history on file.  Her family history includes Lupus in her mother.  Review of Systems  Constitutional: Negative.   HENT: Negative.   Eyes: Negative.   Respiratory: Negative.   Cardiovascular: Negative.   Gastrointestinal: Negative.   Genitourinary: Negative.   Musculoskeletal: Negative.   Skin: Negative.   Neurological: Negative.   Endo/Heme/Allergies: Negative.   Psychiatric/Behavioral: Negative.     OBJECTIVE  Her  height is  (1.753 m) and weight is 133 lb (60.328 kg). Her oral temperature is 98.8 F (37.1 C). Her blood pressure is 94/62 and her pulse is 69. Her respiration is 14 and oxygen saturation is 99%.  The patient's body mass index is 19.63 kg/(m^2).  Physical Exam  Vitals reviewed. Constitutional: She is oriented to person, place, and time. She appears well-developed and well-nourished.  Cardiovascular: Normal rate.   Respiratory: Effort normal.  Musculoskeletal: Normal range of motion.  Neurological: She is alert and oriented to person, place, and time. No cranial nerve deficit.  Skin: Skin is warm and dry.  Psychiatric: She has a normal mood and  affect.    No results found for this or any previous visit (from the past 24 hour(s)).  ASSESSMENT & PLAN  Kristy Petersen was seen today for immunizations.  Diagnoses and all orders for this visit:  Immunizations reviewed and up to date: Patient without [facility] information.  Immunizations cross checked via her school transcripts and she is current.  Her program is requiring a Hepatitis.   -     Hepatitis B vaccine adult IM -     Td vaccine greater than or equal to 7yo preservative free IM    The patient was advised to call or come back to clinic if she does not see an improvement in symptoms, or worsens with the above plan.   Deliah Boston, MHS, PA-C [facility] 10/20/2014 2:01 PM

## 2014-10-22 NOTE — Progress Notes (Signed)
  Medical screening examination/treatment/procedure(s) were performed by non-physician practitioner and as supervising physician I was immediately available for consultation/collaboration.     

## 2014-10-26 ENCOUNTER — Telehealth: Payer: Self-pay

## 2014-10-26 NOTE — Telephone Encounter (Signed)
Patient needs written statement of which immunizations she had at her last visit so that she can turn them to her school.  [phone]

## 2014-10-27 NOTE — Telephone Encounter (Signed)
Letter printed. Unable to LM to let her know that it is up fron for p/u

## 2014-10-31 ENCOUNTER — Ambulatory Visit: Payer: BLUE CROSS/BLUE SHIELD | Admitting: Family Medicine

## 2014-11-01 ENCOUNTER — Ambulatory Visit (INDEPENDENT_AMBULATORY_CARE_PROVIDER_SITE_OTHER): Payer: BLUE CROSS/BLUE SHIELD | Admitting: Family Medicine

## 2014-11-01 VITALS — BP 107/70 | HR 101 | Temp 98.4°F | Resp 17 | Ht 69.5 in | Wt 135.0 lb

## 2014-11-01 DIAGNOSIS — M545 Low back pain: Secondary | ICD-10-CM

## 2014-11-01 DIAGNOSIS — IMO0001 Reserved for inherently not codable concepts without codable children: Secondary | ICD-10-CM

## 2014-11-01 MED ORDER — HYDROCODONE-ACETAMINOPHEN 5-325 MG PO TABS: 1.0000 | ORAL_TABLET | Freq: Four times a day (QID) | ORAL | Status: DC | PRN

## 2014-11-01 MED ORDER — CYCLOBENZAPRINE HCL 5 MG PO TABS: 5.0000 mg | ORAL_TABLET | Freq: Three times a day (TID) | ORAL | Status: DC | PRN

## 2014-11-01 MED ORDER — MELOXICAM 7.5 MG PO TABS: 7.5000 mg | ORAL_TABLET | Freq: Every day | ORAL | Status: DC

## 2014-11-01 NOTE — Progress Notes (Signed)
This is a 34 y.o.female who complains of left lumbar back pain.  Character of pain: sharp Location of pain:  Left lower back Radiation of pain:  non3 Onset associated with:  Lifting husband who has muscular dystrophy Patient has a past history of low back pain for which she recovered after short time  Gretel Cantu denies any urinary symptoms, bowel problems, numbness in the legs, loss of motor power. Sherlie Ban had no fever.  Mahalie Kanner has tried tylenol.  Past Medical History  Diagnosis Date  . Allergy   . Anxiety      No past surgical history on file.  Objective:  middle-aged female in no acute distress. Blood pressure 107/70, pulse 101, temperature 98.4 F (36.9 C), temperature source Oral, resp. rate 17, height 5' 9.5" (1.765 m), weight 135 lb (61.236 kg), last menstrual period 10/24/2014, SpO2 99 %.Body mass index is 19.66 kg/(m^2). Palpation of the back reveals no pain CVA:  nontender Abdomen: soft nontender Peripheral pulses:  DP/PT good Inspection of the back: Reveals no scoliosis Straight-leg raising: neg Motor exam of lower extremity: No abnormal weakness. Reflexes: Symmetric and normal Skin exam: normal  Assessment/Plan: Acute lower back pain without acute neurological findings.   This chart was scribed in my presence and reviewed by me personally.    ICD-9-CM ICD-10-CM   1. Low back syndrome 724.2 M54.5 meloxicam (MOBIC) 7.5 MG tablet     cyclobenzaprine (FLEXERIL) 5 MG tablet     HYDROcodone-acetaminophen (NORCO) 5-325 MG per tablet     Signed, Elvina Sidle, MD

## 2014-11-01 NOTE — Patient Instructions (Signed)

## 2014-12-28 ENCOUNTER — Other Ambulatory Visit: Payer: Self-pay | Admitting: Family Medicine

## 2015-03-06 ENCOUNTER — Other Ambulatory Visit: Payer: Self-pay | Admitting: Family Medicine

## 2015-03-24 ENCOUNTER — Other Ambulatory Visit: Payer: Self-pay | Admitting: Obstetrics

## 2015-03-27 LAB — CYTOLOGY - PAP

## 2015-04-06 ENCOUNTER — Ambulatory Visit (INDEPENDENT_AMBULATORY_CARE_PROVIDER_SITE_OTHER): Payer: BLUE CROSS/BLUE SHIELD | Admitting: Physician Assistant

## 2015-04-06 ENCOUNTER — Ambulatory Visit (INDEPENDENT_AMBULATORY_CARE_PROVIDER_SITE_OTHER): Payer: BLUE CROSS/BLUE SHIELD

## 2015-04-06 VITALS — BP 127/73 | HR 93 | Temp 99.1°F | Resp 16 | Ht 69.5 in | Wt 131.0 lb

## 2015-04-06 DIAGNOSIS — M545 Low back pain, unspecified: Secondary | ICD-10-CM

## 2015-04-06 DIAGNOSIS — IMO0001 Reserved for inherently not codable concepts without codable children: Secondary | ICD-10-CM

## 2015-04-06 LAB — POCT URINALYSIS DIP (MANUAL ENTRY)
BILIRUBIN UA: NEGATIVE
BILIRUBIN UA: NEGATIVE
Blood, UA: NEGATIVE
GLUCOSE UA: NEGATIVE
LEUKOCYTES UA: NEGATIVE
Nitrite, UA: NEGATIVE
Protein Ur, POC: NEGATIVE
Urobilinogen, UA: 0.2
pH, UA: 7

## 2015-04-06 LAB — POC MICROSCOPIC URINALYSIS (UMFC): MUCUS RE: ABSENT

## 2015-04-06 MED ORDER — HYDROCODONE-ACETAMINOPHEN 5-325 MG PO TABS: 1.0000 | ORAL_TABLET | Freq: Four times a day (QID) | ORAL | Status: DC | PRN

## 2015-04-06 MED ORDER — MELOXICAM 7.5 MG PO TABS: ORAL_TABLET | ORAL | Status: DC

## 2015-04-06 MED ORDER — CYCLOBENZAPRINE HCL 5 MG PO TABS
5.0000 mg | ORAL_TABLET | Freq: Three times a day (TID) | ORAL | Status: DC | PRN
Start: 2015-04-06 — End: 2015-05-23

## 2015-04-06 NOTE — Patient Instructions (Addendum)
Because you received an x-ray today, you will receive an invoice from [facility]. Please contact [facility] at [phone] with questions or concerns regarding your invoice. Our billing staff will not be able to assist you with those questions.  Use mobic daily. Do not use any other products with this containing ibuprofen, naprosyn or aspirin. You MAY use tylenol with this. Use norco at night. Flexeril as needed. Heat, gentle massage and gentle stretching can help. Remain active, as inactivity can cause more pain. Don't do anything too strenuous though. If you develop new numbness, weakness or incontinence of urine or bowel, return to clinic or go to the emergency room ASAP. If your symptoms are not improving in 2 weeks, call and see if you can get more PT. IF you need a referral, let me know.

## 2015-04-06 NOTE — Progress Notes (Signed)
[facility] [address], [place] [address] [phone]  Date:  04/06/2015   Name:  Kristy Petersen   DOB:  07/07/1980   MRN:  [id number]  PCP:  No PCP Per Patient    Chief Complaint: Back Pain   History of Present Illness:  This is a 35 y.o. female  who is presenting with lower left sided back pain x 3 days. First injured her back 10/2014. Her husband is wheelchair bound and she is his primary caretaker. She hurt her back initially when she was transferring him. She was treated with mobic, flexeril and PT and her pain resolved. She states she stopped taking the mobic 6 weeks ago. Since that she has tweaked the same area twice. She is unsure how she hurt her back this time, she thinks when she was rolling him over in the bed. She woke 3 days ago with the pain. No radiation, weakness, paresthesias. No problems with bowel or bladder. Been taking tylenol and using heating pad and not helping.  Review of Systems:  Review of Systems See HPI  There are no active problems to display for this patient.  Home meds: None  No Known Allergies  History reviewed. No pertinent past surgical history.  Social History  Substance Use Topics  . Smoking status: Former Games developer  . Smokeless tobacco: Never Used  . Alcohol Use: No    Family History  Problem Relation Age of Onset  . Lupus Mother     Medication list has been reviewed and updated.  Physical Examination:  Physical Exam  Constitutional: She is oriented to person, place, and time. She appears well-developed and well-nourished. No distress.  HENT:  Head: Normocephalic and atraumatic.  Right Ear: Hearing normal.  Left Ear: Hearing normal.  Nose: Nose normal.  Eyes: Conjunctivae and lids are normal. Right eye exhibits no discharge. Left eye exhibits no discharge. No scleral icterus.  Cardiovascular: Normal rate, regular rhythm, normal heart sounds and normal pulses.   No murmur heard. Pulmonary/Chest: Effort  normal and breath sounds normal. No respiratory distress. She has no wheezes. She has no rhonchi. She has no rales.  Abdominal: Soft. Normal appearance. There is no tenderness.  Musculoskeletal: Normal range of motion.       Lumbar back: She exhibits tenderness (left paraspinal) and bony tenderness (general tenderness along lumbar spinal processes). She exhibits normal range of motion.  Straight leg raise negative  Neurological: She is alert and oriented to person, place, and time. She has normal strength and normal reflexes. No sensory deficit. Gait normal.  Skin: Skin is warm, dry and intact. No lesion and no rash noted.  Psychiatric: She has a normal mood and affect. Her speech is normal and behavior is normal. Thought content normal.    BP 127/73 mmHg  Pulse 93  Temp(Src) 99.1 F (37.3 C) (Oral)  Resp 16  Ht 5' 9.5" (1.765 m)  Wt 131 lb (59.421 kg)  BMI 19.07 kg/m2  SpO2 98%  LMP 03/12/2015  Results for orders placed or performed in visit on 04/06/15  POCT urinalysis dipstick  Result Value Ref Range   Color, UA yellow yellow   Clarity, UA clear clear   Glucose, UA negative negative   Bilirubin, UA negative negative   Ketones, POC UA negative negative   Spec Grav, UA <=1.005    Blood, UA negative negative   pH, UA 7.0    Protein Ur, POC negative negative   Urobilinogen, UA 0.2  Nitrite, UA Negative Negative   Leukocytes, UA Negative Negative  POCT Microscopic Urinalysis ([facility])  Result Value Ref Range   WBC,UR,HPF,POC None None WBC/hpf   RBC,UR,HPF,POC None None RBC/hpf   Bacteria None None, Too numerous to count   Mucus Absent Absent   Epithelial Cells, UR Per Microscopy Few (A) None, Too numerous to count cells/hpf   Dg Lumbar Spine Complete  04/06/2015  CLINICAL DATA:  Lower left-sided back pain for 3 days. Previous injury. EXAM: LUMBAR SPINE - COMPLETE 4+ VIEW COMPARISON:  Lumbar spine plain film dated 11/18/2013. FINDINGS: Alignment of the lumbar spine is normal.  Bone mineralization is normal. No fracture line or displaced fracture fragment seen. Posterior elements appear well aligned throughout. Upper sacrum appears intact and well aligned. No significant degenerative change appreciated. Paravertebral soft tissues are unremarkable. IMPRESSION: Negative. Electronically Signed   By: Bary Richard M.D.   On: 04/06/2015 17:31    Assessment and Plan:  1. Left-sided low back pain without sciatica Radiograph negative. UA negative. Treat again with flexeril and mobic. Norco at night to help with sleep. She will wait and see if gets better over the next couple weeks. If not, return to PT. Return precautions discussed. - POCT urinalysis dipstick - POCT Microscopic Urinalysis ([facility]) - DG Lumbar Spine Complete; Future - cyclobenzaprine (FLEXERIL) 5 MG tablet; Take 1 tablet (5 mg total) by mouth 3 (three) times daily as needed for muscle spasms.  Dispense: 30 tablet; Refill: 0 - meloxicam (MOBIC) 7.5 MG tablet; TAKE 1 TABLET BY  MOUTH EVERY DAY  Dispense: 30 tablet; Refill: 1 - HYDROcodone-acetaminophen (NORCO) 5-325 MG tablet; Take 1 tablet by mouth every 6 (six) hours as needed for moderate pain.  Dispense: 15 tablet; Refill: 0    [employer] [employer]. Dyke Brackett, MHS [facility]  04/06/2015

## 2015-05-23 ENCOUNTER — Ambulatory Visit (INDEPENDENT_AMBULATORY_CARE_PROVIDER_SITE_OTHER): Payer: BLUE CROSS/BLUE SHIELD | Admitting: Family Medicine

## 2015-05-23 VITALS — BP 110/78 | HR 87 | Temp 98.2°F | Resp 16 | Wt 133.4 lb

## 2015-05-23 DIAGNOSIS — M545 Low back pain, unspecified: Secondary | ICD-10-CM

## 2015-05-23 DIAGNOSIS — G8929 Other chronic pain: Secondary | ICD-10-CM | POA: Diagnosis not present

## 2015-05-23 MED ORDER — CYCLOBENZAPRINE HCL 5 MG PO TABS: 5.0000 mg | ORAL_TABLET | Freq: Three times a day (TID) | ORAL | Status: DC | PRN

## 2015-05-23 MED ORDER — MELOXICAM 7.5 MG PO TABS: ORAL_TABLET | ORAL | Status: DC

## 2015-05-23 NOTE — Addendum Note (Signed)
Addended by: Elvina SidleLAUENSTEIN, Anapaola Kinsel on: 05/23/2015 01:13 PM   Modules accepted: Orders

## 2015-05-23 NOTE — Progress Notes (Addendum)
This a 35 year old woman with an 13-year-old child and a disabled husband. She goes to school full-time and cares for her family.  She comes in with low back pain. She's had several years of progressive low back pain that recurs periodically. She says that she has morning stiffness and tries to do stretches to get warmed up but the back pain last much of the day. She's had some relief with meloxicam and would like a refill. She would also like a refill on her physical therapy consultation with integrative therapy.  Patient denies rash, sore throat, other joint pain other than her right DIP index finger which she attributes to cracking her knuckles.  Objective: This is a thin woman in no acute distress BP 110/78 mmHg  Pulse 87  Temp(Src) 98.2 F (36.8 C) (Oral)  Resp 16  Wt 133 lb 6.4 oz (60.51 kg)  SpO2 98%  LMP 05/10/2015 HEENT: Unremarkable Inspection of the back reveals fairly stiff lumbar region and patient is unable to reach her toes when she bends over.  She has slight straight leg raising signs when her left leg is extended at 90 when sitting. Reflexes in knees and ankles are normal Skin is unremarkable  This chart was scribed in my presence and reviewed by me personally.    ICD-9-CM ICD-10-CM   1. Midline low back pain without sciatica 724.2 M54.5 POCT SEDIMENTATION RATE     HLA-B27 Antigen     Ambulatory referral to Physical Therapy  2. Left-sided low back pain without sciatica 724.2 M54.5 meloxicam (MOBIC) 7.5 MG tablet     Ambulatory referral to Physical Therapy     cyclobenzaprine (FLEXERIL) 5 MG tablet  3. Chronic low back pain 724.2 M54.5    338.29 G89.29      Signed, Robyn Haber, MD

## 2015-05-23 NOTE — Patient Instructions (Addendum)
I'm concerned with the ongoing nature of this back pain. It could be that the multiple pole kinds of activities that you have to do in her daily life or making this get worse over time.  There is another condition called ankylosing spondylitis they can cause these symptoms and I am running test for this. They will be back in a week.  IF you received an x-ray today, you will receive an invoice from [facility]. Please contact [facility] at [phone] with questions or concerns regarding your invoice.   IF you received labwork today, you will receive an invoice from [facility]. Please contact [facility] at [phone] with questions or concerns regarding your invoice.   Our billing staff will not be able to assist you with questions regarding bills from these companies.  You will be contacted with the lab results as soon as they are available. The fastest way to get your results is to activate your My Chart account. Instructions are located on the last page of this paperwork. If you have not heard from us regarding the results in 2 weeks, please contact this office.

## 2015-05-29 LAB — HLA-B27 ANTIGEN: DNA Result:: NEGATIVE

## 2015-07-03 ENCOUNTER — Other Ambulatory Visit: Payer: Self-pay | Admitting: Family Medicine

## 2015-07-05 NOTE — Telephone Encounter (Signed)
Dr L, is it OK to give 90 day RF for pt so that she can continue to take it? I see that her lab result was normal and that you have referred to PT also, but OK for her to remain on this?

## 2015-08-03 ENCOUNTER — Other Ambulatory Visit: Payer: Self-pay

## 2015-08-03 DIAGNOSIS — M545 Low back pain, unspecified: Secondary | ICD-10-CM

## 2015-08-03 MED ORDER — MELOXICAM 7.5 MG PO TABS: ORAL_TABLET | ORAL | Status: DC

## 2015-09-14 DIAGNOSIS — D239 Other benign neoplasm of skin, unspecified: Secondary | ICD-10-CM | POA: Diagnosis not present

## 2015-09-14 DIAGNOSIS — L709 Acne, unspecified: Secondary | ICD-10-CM | POA: Diagnosis not present

## 2015-09-14 DIAGNOSIS — Z79899 Other long term (current) drug therapy: Secondary | ICD-10-CM | POA: Diagnosis not present

## 2015-09-29 ENCOUNTER — Encounter: Payer: Self-pay | Admitting: Urgent Care

## 2015-09-29 ENCOUNTER — Ambulatory Visit (INDEPENDENT_AMBULATORY_CARE_PROVIDER_SITE_OTHER): Payer: BLUE CROSS/BLUE SHIELD | Admitting: Urgent Care

## 2015-09-29 VITALS — BP 108/70 | HR 96 | Temp 98.7°F | Resp 17 | Ht 69.5 in | Wt 141.0 lb

## 2015-09-29 DIAGNOSIS — H5711 Ocular pain, right eye: Secondary | ICD-10-CM | POA: Diagnosis not present

## 2015-09-29 DIAGNOSIS — H578 Other specified disorders of eye and adnexa: Secondary | ICD-10-CM

## 2015-09-29 DIAGNOSIS — H5789 Other specified disorders of eye and adnexa: Secondary | ICD-10-CM

## 2015-09-29 DIAGNOSIS — Z973 Presence of spectacles and contact lenses: Secondary | ICD-10-CM | POA: Diagnosis not present

## 2015-09-29 MED ORDER — POLYMYXIN B-TRIMETHOPRIM 10000-0.1 UNIT/ML-% OP SOLN: 1.0000 [drp] | OPHTHALMIC | Status: DC

## 2015-09-29 MED ORDER — ERYTHROMYCIN 5 MG/GM OP OINT: 1.0000 "application " | TOPICAL_OINTMENT | Freq: Four times a day (QID) | OPHTHALMIC | Status: DC

## 2015-09-29 NOTE — Progress Notes (Signed)
    MRN: [id number] DOB: [birth year]  Subjective:   Kristy Petersen is a 35 y.o. female presenting for chief complaint of Eye Pain  Reports 2 day history of right eye discomfort, itching. Her discomfort feels like a tightness, grittiness. She uses contacts, has switched them out but has had a stinging sensation with using them. Had crusting this morning as well, watery eye. Had some photosensitivity while driving here but no pain with eye movement. She has since switched to using her glasses. Denies fever, trauma, eye redness, sinus pain, cough, ear pain, sore throat.   Kristy Petersen has a current medication list which includes the following prescription(s): acetaminophen, cyclobenzaprine, and meloxicam. Also has No Known Allergies.  Kristy Petersen  has a past medical history of Allergy and Anxiety. Also  has no past surgical history on file.  Objective:   Vitals: BP 108/70 mmHg  Pulse 96  Temp(Src) 98.7 F (37.1 C) (Oral)  Resp 17  Ht 5' 9.5" (1.765 m)  Wt 141 lb (63.957 kg)  BMI 20.53 kg/m2  SpO2 100%  Physical Exam  Constitutional: She is oriented to person, place, and time. She appears well-developed and well-nourished.  HENT:  TM's intact bilaterally, no effusions or erythema. Nasal turbinates pink and moist, nasal passages patent. No sinus tenderness. Oropharynx clear, mucous membranes moist, dentition in good repair.  Eyes: EOM are normal. Pupils are equal, round, and reactive to light. Right eye exhibits discharge (moderately watery). Left eye exhibits no discharge. No scleral icterus.  Neck: Normal range of motion. Neck supple.  Cardiovascular: Normal rate.   Pulmonary/Chest: Effort normal and breath sounds normal.  Lymphadenopathy:    She has no cervical adenopathy.  Neurological: She is alert and oriented to person, place, and time.  Skin: Skin is warm and dry.   Eye Exam: Eyelids everted and swept for foreign body. The eye was stained with fluorescein. Examination under woods lamp does  not reveal a foreign body or area of increased stain uptake. The eye was then irrigated copiously with saline.  Assessment and Plan :   1. Eye pain, right 2. Discharge of eye, right 3. Wears contact lenses - Start erythromycin to cover for right eye conjunctivitis. Patient will use Polytrim if erythromycin is too expensive. RTC if symptoms are not improved tomorrow or on Monday.  Wallis BambergMario Lamark Schue, PA-C [facility] [phone] 09/29/2015 6:21 PM

## 2015-09-29 NOTE — Patient Instructions (Signed)

## 2015-10-24 DIAGNOSIS — Z79899 Other long term (current) drug therapy: Secondary | ICD-10-CM | POA: Diagnosis not present

## 2015-10-24 DIAGNOSIS — L709 Acne, unspecified: Secondary | ICD-10-CM | POA: Diagnosis not present

## 2015-10-28 DIAGNOSIS — Z23 Encounter for immunization: Secondary | ICD-10-CM | POA: Diagnosis not present

## 2015-11-24 DIAGNOSIS — Z79899 Other long term (current) drug therapy: Secondary | ICD-10-CM | POA: Diagnosis not present

## 2015-11-24 DIAGNOSIS — L709 Acne, unspecified: Secondary | ICD-10-CM | POA: Diagnosis not present

## 2015-11-27 ENCOUNTER — Other Ambulatory Visit: Payer: Self-pay | Admitting: Family Medicine

## 2015-11-30 ENCOUNTER — Other Ambulatory Visit: Payer: Self-pay | Admitting: Urgent Care

## 2015-11-30 DIAGNOSIS — M545 Low back pain, unspecified: Secondary | ICD-10-CM

## 2015-12-06 ENCOUNTER — Other Ambulatory Visit: Payer: Self-pay | Admitting: Urgent Care

## 2015-12-06 DIAGNOSIS — M545 Low back pain, unspecified: Secondary | ICD-10-CM

## 2015-12-08 ENCOUNTER — Ambulatory Visit (INDEPENDENT_AMBULATORY_CARE_PROVIDER_SITE_OTHER): Payer: BLUE CROSS/BLUE SHIELD | Admitting: Physician Assistant

## 2015-12-08 DIAGNOSIS — M545 Low back pain, unspecified: Secondary | ICD-10-CM

## 2015-12-08 MED ORDER — CYCLOBENZAPRINE HCL 10 MG PO TABS: 5.0000 mg | ORAL_TABLET | Freq: Three times a day (TID) | ORAL | 5 refills | Status: DC | PRN

## 2015-12-08 MED ORDER — MELOXICAM 7.5 MG PO TABS: 7.5000 mg | ORAL_TABLET | Freq: Every day | ORAL | 1 refills | Status: DC

## 2015-12-08 NOTE — Patient Instructions (Addendum)
Please ice the area three times per day for 15 minutes.  You should perform three of the pictured stretches three times per day, then follow it with the ice.  Repeat any movement 5 times.  If it says hold, hold for 10 seconds.   Low Back Sprain With Rehab A sprain is an injury in which a ligament is torn. The ligaments of the lower back are vulnerable to sprains. However, they are strong and require great force to be injured. These ligaments are important for stabilizing the spinal column. Sprains are classified into three categories. Grade 1 sprains cause pain, but the tendon is not lengthened. Grade 2 sprains include a lengthened ligament, due to the ligament being stretched or partially ruptured. With grade 2 sprains there is still function, although the function may be decreased. Grade 3 sprains involve a complete tear of the tendon or muscle, and function is usually impaired. SYMPTOMS   Severe pain in the lower back.  Sometimes, a feeling of a "pop," "snap," or tear, at the time of injury.  Tenderness and sometimes swelling at the injury site.  Uncommonly, bruising (contusion) within 48 hours of injury.  Muscle spasms in the back. CAUSES  Low back sprains occur when a force is placed on the ligaments that is greater than they can handle. Common causes of injury include:  Performing a stressful act while off-balance.  Repetitive stressful activities that involve movement of the lower back.  Direct hit (trauma) to the lower back. RISK INCREASES WITH:  Contact sports (football, wrestling).  Collisions (major skiing accidents).  Sports that require throwing or lifting (baseball, weightlifting).  Sports involving twisting of the spine (gymnastics, diving, tennis, golf).  Poor strength and flexibility.  Inadequate protection.  Previous back injury or surgery (especially fusion). PREVENTION  Wear properly fitted and padded protective equipment.  Warm up and stretch properly  before activity.  Allow for adequate recovery between workouts.  Maintain physical fitness:  Strength, flexibility, and endurance.  Cardiovascular fitness.  Maintain a healthy body weight. PROGNOSIS  If treated properly, low back sprains usually heal with non-surgical treatment. The length of time for healing depends on the severity of the injury.  RELATED COMPLICATIONS   Recurring symptoms, resulting in a chronic problem.  Chronic inflammation and pain in the low back.  Delayed healing or resolution of symptoms, especially if activity is resumed too soon.  Prolonged impairment.  Unstable or arthritic joints of the low back. TREATMENT  Treatment first involves the use of ice and medicine, to reduce pain and inflammation. The use of strengthening and stretching exercises may help reduce pain with activity. These exercises may be performed at home or with a therapist. Severe injuries may require referral to a therapist for further evaluation and treatment, such as ultrasound. Your caregiver may advise that you wear a back brace or corset, to help reduce pain and discomfort. Often, prolonged bed rest results in greater harm then benefit. Corticosteroid injections may be recommended. However, these should be reserved for the most serious cases. It is important to avoid using your back when lifting objects. At night, sleep on your back on a firm mattress, with a pillow placed under your knees. If non-surgical treatment is unsuccessful, surgery may be needed.  MEDICATION   If pain medicine is needed, nonsteroidal anti-inflammatory medicines (aspirin and ibuprofen), or other minor pain relievers (acetaminophen), are often advised.  Do not take pain medicine for 7 days before surgery.  Prescription pain relievers may be given,  if your caregiver thinks they are needed. Use only as directed and only as much as you need.  Ointments applied to the skin may be helpful.  Corticosteroid  injections may be given by your caregiver. These injections should be reserved for the most serious cases, because they may only be given a certain number of times. HEAT AND COLD  Cold treatment (icing) should be applied for 10 to 15 minutes every 2 to 3 hours for inflammation and pain, and immediately after activity that aggravates your symptoms. Use ice packs or an ice massage.  Heat treatment may be used before performing stretching and strengthening activities prescribed by your caregiver, physical therapist, or athletic trainer. Use a heat Kristy Petersen or a warm water soak. SEEK MEDICAL CARE IF:   Symptoms get worse or do not improve in 2 to 4 weeks, despite treatment.  You develop numbness or weakness in either leg.  You lose bowel or bladder function.  Any of the following occur after surgery: fever, increased pain, swelling, redness, drainage of fluids, or bleeding in the affected area.  New, unexplained symptoms develop. (Drugs used in treatment may produce side effects.) EXERCISES  RANGE OF MOTION (ROM) AND STRETCHING EXERCISES - Low Back Sprain Most people with lower back pain will find that their symptoms get worse with excessive bending forward (flexion) or arching at the lower back (extension). The exercises that will help resolve your symptoms will focus on the opposite motion.  Your physician, physical therapist or athletic trainer will help you determine which exercises will be most helpful to resolve your lower back pain. Do not complete any exercises without first consulting with your caregiver. Discontinue any exercises which make your symptoms worse, until you speak to your caregiver. If you have pain, numbness or tingling which travels down into your buttocks, leg or foot, the goal of the therapy is for these symptoms to move closer to your back and eventually resolve. Sometimes, these leg symptoms will get better, but your lower back pain may worsen. This is often an indication of  progress in your rehabilitation. Be very alert to any changes in your symptoms and the activities in which you participated in the 24 hours prior to the change. Sharing this information with your caregiver will allow him or her to most efficiently treat your condition. These exercises may help you when beginning to rehabilitate your injury. Your symptoms may resolve with or without further involvement from your physician, physical therapist or athletic trainer. While completing these exercises, remember:   Restoring tissue flexibility helps normal motion to return to the joints. This allows healthier, less painful movement and activity.  An effective stretch should be held for at least 30 seconds.  A stretch should never be painful. You should only feel a gentle lengthening or release in the stretched tissue. FLEXION RANGE OF MOTION AND STRETCHING EXERCISES: STRETCH - Flexion, Single Knee to Chest   Lie on a firm bed or floor with both legs extended in front of you.  Keeping one leg in contact with the floor, bring your opposite knee to your chest. Hold your leg in place by either grabbing behind your thigh or at your knee.  Pull until you feel a gentle stretch in your low back. Hold __________ seconds.  Slowly release your grasp and repeat the exercise with the opposite side. Repeat __________ times. Complete this exercise __________ times per day.  STRETCH - Flexion, Double Knee to Chest  Lie on a firm bed or  floor with both legs extended in front of you.  Keeping one leg in contact with the floor, bring your opposite knee to your chest.  Tense your stomach muscles to support your back and then lift your other knee to your chest. Hold your legs in place by either grabbing behind your thighs or at your knees.  Pull both knees toward your chest until you feel a gentle stretch in your low back. Hold __________ seconds.  Tense your stomach muscles and slowly return one leg at a time to the  floor. Repeat __________ times. Complete this exercise __________ times per day.  STRETCH - Low Trunk Rotation  Lie on a firm bed or floor. Keeping your legs in front of you, bend your knees so they are both pointed toward the ceiling and your feet are flat on the floor.  Extend your arms out to the side. This will stabilize your upper body by keeping your shoulders in contact with the floor.  Gently and slowly drop both knees together to one side until you feel a gentle stretch in your low back. Hold for __________ seconds.  Tense your stomach muscles to support your lower back as you bring your knees back to the starting position. Repeat the exercise to the other side. Repeat __________ times. Complete this exercise __________ times per day  EXTENSION RANGE OF MOTION AND FLEXIBILITY EXERCISES: STRETCH - Extension, Prone on Elbows   Lie on your stomach on the floor, a bed will be too soft. Place your palms about shoulder width apart and at the height of your head.  Place your elbows under your shoulders. If this is too painful, stack pillows under your chest.  Allow your body to relax so that your hips drop lower and make contact more completely with the floor.  Hold this position for __________ seconds.  Slowly return to lying flat on the floor. Repeat __________ times. Complete this exercise __________ times per day.  RANGE OF MOTION - Extension, Prone Press Ups  Lie on your stomach on the floor, a bed will be too soft. Place your palms about shoulder width apart and at the height of your head.  Keeping your back as relaxed as possible, slowly straighten your elbows while keeping your hips on the floor. You may adjust the placement of your hands to maximize your comfort. As you gain motion, your hands will come more underneath your shoulders.  Hold this position __________ seconds.  Slowly return to lying flat on the floor. Repeat __________ times. Complete this exercise  __________ times per day.  RANGE OF MOTION- Quadruped, Neutral Spine   Assume a hands and knees position on a firm surface. Keep your hands under your shoulders and your knees under your hips. You may place padding under your knees for comfort.  Drop your head and point your tailbone toward the ground below you. This will round out your lower back like an angry cat. Hold this position for __________ seconds.  Slowly lift your head and release your tail bone so that your back sags into a large arch, like an old horse.  Hold this position for __________ seconds.  Repeat this until you feel limber in your low back.  Now, find your "sweet spot." This will be the most comfortable position somewhere between the two previous positions. This is your neutral spine. Once you have found this position, tense your stomach muscles to support your low back.  Hold this position for __________ seconds. Repeat  __________ times. Complete this exercise __________ times per day.  STRENGTHENING EXERCISES - Low Back Sprain These exercises may help you when beginning to rehabilitate your injury. These exercises should be done near your "sweet spot." This is the neutral, low-back arch, somewhere between fully rounded and fully arched, that is your least painful position. When performed in this safe range of motion, these exercises can be used for people who have either a flexion or extension based injury. These exercises may resolve your symptoms with or without further involvement from your physician, physical therapist or athletic trainer. While completing these exercises, remember:   Muscles can gain both the endurance and the strength needed for everyday activities through controlled exercises.  Complete these exercises as instructed by your physician, physical therapist or athletic trainer. Increase the resistance and repetitions only as guided.  You may experience muscle soreness or fatigue, but the pain or  discomfort you are trying to eliminate should never worsen during these exercises. If this pain does worsen, stop and make certain you are following the directions exactly. If the pain is still present after adjustments, discontinue the exercise until you can discuss the trouble with your caregiver. STRENGTHENING - Deep Abdominals, Pelvic Tilt   Lie on a firm bed or floor. Keeping your legs in front of you, bend your knees so they are both pointed toward the ceiling and your feet are flat on the floor.  Tense your lower abdominal muscles to press your low back into the floor. This motion will rotate your pelvis so that your tail bone is scooping upwards rather than pointing at your feet or into the floor. With a gentle tension and even breathing, hold this position for __________ seconds. Repeat __________ times. Complete this exercise __________ times per day.  STRENGTHENING - Abdominals, Crunches   Lie on a firm bed or floor. Keeping your legs in front of you, bend your knees so they are both pointed toward the ceiling and your feet are flat on the floor. Cross your arms over your chest.  Slightly tip your chin down without bending your neck.  Tense your abdominals and slowly lift your trunk high enough to just clear your shoulder blades. Lifting higher can put excessive stress on the lower back and does not further strengthen your abdominal muscles.  Control your return to the starting position. Repeat __________ times. Complete this exercise __________ times per day.  STRENGTHENING - Quadruped, Opposite UE/LE Lift   Assume a hands and knees position on a firm surface. Keep your hands under your shoulders and your knees under your hips. You may place padding under your knees for comfort.  Find your neutral spine and gently tense your abdominal muscles so that you can maintain this position. Your shoulders and hips should form a rectangle that is parallel with the floor and is not  twisted.  Keeping your trunk steady, lift your right hand no higher than your shoulder and then your left leg no higher than your hip. Make sure you are not holding your breath. Hold this position for __________ seconds.  Continuing to keep your abdominal muscles tense and your back steady, slowly return to your starting position. Repeat with the opposite arm and leg. Repeat __________ times. Complete this exercise __________ times per day.  STRENGTHENING - Abdominals and Quadriceps, Straight Leg Raise   Lie on a firm bed or floor with both legs extended in front of you.  Keeping one leg in contact with the floor, bend  the other knee so that your foot can rest flat on the floor.  Find your neutral spine, and tense your abdominal muscles to maintain your spinal position throughout the exercise.  Slowly lift your straight leg off the floor about 6 inches for a count of 15, making sure to not hold your breath.  Still keeping your neutral spine, slowly lower your leg all the way to the floor. Repeat this exercise with each leg __________ times. Complete this exercise __________ times per day. POSTURE AND BODY MECHANICS CONSIDERATIONS - Low Back Sprain Keeping correct posture when sitting, standing or completing your activities will reduce the stress put on different body tissues, allowing injured tissues a chance to heal and limiting painful experiences. The following are general guidelines for improved posture. Your physician or physical therapist will provide you with any instructions specific to your needs. While reading these guidelines, remember:  The exercises prescribed by your provider will help you have the flexibility and strength to maintain correct postures.  The correct posture provides the best environment for your joints to work. All of your joints have less wear and tear when properly supported by a spine with good posture. This means you will experience a healthier, less painful  body.  Correct posture must be practiced with all of your activities, especially prolonged sitting and standing. Correct posture is as important when doing repetitive low-stress activities (typing) as it is when doing a single heavy-load activity (lifting). RESTING POSITIONS Consider which positions are most painful for you when choosing a resting position. If you have pain with flexion-based activities (sitting, bending, stooping, squatting), choose a position that allows you to rest in a less flexed posture. You would want to avoid curling into a fetal position on your side. If your pain worsens with extension-based activities (prolonged standing, working overhead), avoid resting in an extended position such as sleeping on your stomach. Most people will find more comfort when they rest with their spine in a more neutral position, neither too rounded nor too arched. Lying on a non-sagging bed on your side with a pillow between your knees, or on your back with a pillow under your knees will often provide some relief. Keep in mind, being in any one position for a prolonged period of time, no matter how correct your posture, can still lead to stiffness. PROPER SITTING POSTURE In order to minimize stress and discomfort on your spine, you must sit with correct posture. Sitting with good posture should be effortless for a healthy body. Returning to good posture is a gradual process. Many people can work toward this most comfortably by using various supports until they have the flexibility and strength to maintain this posture on their own. When sitting with proper posture, your ears will fall over your shoulders and your shoulders will fall over your hips. You should use the back of the chair to support your upper back. Your lower back will be in a neutral position, just slightly arched. You may place a small pillow or folded towel at the base of your lower back for  support.  When working at a desk, create an  environment that supports good, upright posture. Without extra support, muscles tire, which leads to excessive strain on joints and other tissues. Keep these recommendations in mind: CHAIR:  A chair should be able to slide under your desk when your back makes contact with the back of the chair. This allows you to work closely.  The chair's height should  allow your eyes to be level with the upper part of your monitor and your hands to be slightly lower than your elbows. BODY POSITION  Your feet should make contact with the floor. If this is not possible, use a foot rest.  Keep your ears over your shoulders. This will reduce stress on your neck and low back. INCORRECT SITTING POSTURES  If you are feeling tired and unable to assume a healthy sitting posture, do not slouch or slump. This puts excessive strain on your back tissues, causing more damage and pain. Healthier options include:  Using more support, like a lumbar pillow.  Switching tasks to something that requires you to be upright or walking.  Talking a brief walk.  Lying down to rest in a neutral-spine position. PROLONGED STANDING WHILE SLIGHTLY LEANING FORWARD  When completing a task that requires you to lean forward while standing in one place for a long time, place either foot up on a stationary 2-4 inch high object to help maintain the best posture. When both feet are on the ground, the lower back tends to lose its slight inward curve. If this curve flattens (or becomes too large), then the back and your other joints will experience too much stress, tire more quickly, and can cause pain. CORRECT STANDING POSTURES Proper standing posture should be assumed with all daily activities, even if they only take a few moments, like when brushing your teeth. As in sitting, your ears should fall over your shoulders and your shoulders should fall over your hips. You should keep a slight tension in your abdominal muscles to brace your spine.  Your tailbone should point down to the ground, not behind your body, resulting in an over-extended swayback posture.  INCORRECT STANDING POSTURES  Common incorrect standing postures include a forward head, locked knees and/or an excessive swayback. WALKING Walk with an upright posture. Your ears, shoulders and hips should all line-up. PROLONGED ACTIVITY IN A FLEXED POSITION When completing a task that requires you to bend forward at your waist or lean over a low surface, try to find a way to stabilize 3 out of 4 of your limbs. You can place a hand or elbow on your thigh or rest a knee on the surface you are reaching across. This will provide you more stability, so that your muscles do not tire as quickly. By keeping your knees relaxed, or slightly bent, you will also reduce stress across your lower back. CORRECT LIFTING TECHNIQUES DO :  Assume a wide stance. This will provide you more stability and the opportunity to get as close as possible to the object which you are lifting.  Tense your abdominals to brace your spine. Bend at the knees and hips. Keeping your back locked in a neutral-spine position, lift using your leg muscles. Lift with your legs, keeping your back straight.  Test the weight of unknown objects before attempting to lift them.  Try to keep your elbows locked down at your sides in order get the best strength from your shoulders when carrying an object.  Always ask for help when lifting heavy or awkward objects. INCORRECT LIFTING TECHNIQUES DO NOT:   Lock your knees when lifting, even if it is a small object.  Bend and twist. Pivot at your feet or move your feet when needing to change directions.  Assume that you can safely pick up even a paperclip without proper posture.   This information is not intended to replace advice given to you by  your health care provider. Make sure you discuss any questions you have with your health care provider.   Document Released:  02/25/2005 Document Revised: 03/18/2014 Document Reviewed: 06/09/2008 Elsevier Interactive Patient Education 2016 [employer].     IF you received an x-ray today, you will receive an invoice from [facility]. Please contact [facility] at [phone] with questions or concerns regarding your invoice.   IF you received labwork today, you will receive an invoice from [employer]. Please contact [facility] at [phone] with questions or concerns regarding your invoice.   Our billing staff will not be able to assist you with questions regarding bills from these companies.  You will be contacted with the lab results as soon as they are available. The fastest way to get your results is to activate your My Chart account. Instructions are located on the last page of this paperwork. If you have not heard from [place] regarding the results in 2 weeks, please contact this office.

## 2015-12-09 NOTE — Progress Notes (Signed)
[facility] [address], [place] [id number] [phone]  Date:  12/08/2015   Name:  Kristy BanSarah Encalada   DOB:  02-22-1981   MRN:  [id number]  PCP:  No PCP Per Patient    History of Present Illness:  Kristy Petersen is a 35 y.o. female patient who [facility].   Patient states that she has had a week's worth of low back pain. This is in the same location as it is when she has flareups. She states that she has been helping to ambulate her disabled husband and has had a lot of lower back pain or present on her left lower side. There is no radiating numbness or tingling. She has noticed no warmth to the area. She denies instability.    Patient Active Problem List   Diagnosis Date Noted  . Chronic low back pain 05/23/2015    Past Medical History:  Diagnosis Date  . Allergy   . Anxiety     History reviewed. No pertinent surgical history.  Social History  Substance Use Topics  . Smoking status: Former Games developermoker  . Smokeless tobacco: Never Used  . Alcohol use No    Family History  Problem Relation Age of Onset  . Lupus Mother     No Known Allergies  Medication list has been reviewed and updated.  Current Outpatient Prescriptions on File Prior to Visit  Medication Sig Dispense Refill  . acetaminophen (TYLENOL) 325 MG tablet Take 650 mg by mouth every 6 (six) hours as needed. Reported on 04/06/2015     No current facility-administered medications on file prior to visit.     ROS ROS otherwise unremarkable unless listed above.   Physical Examination: BP 112/70 (BP Location: Right Arm, Patient Position: Sitting, Cuff Size: Small)   Pulse 87   Temp 98.6 F (37 C) (Oral)   Resp 16   Ht 5' 9.5" (1.765 m)   Wt 140 lb (63.5 kg)   SpO2 100%   BMI 20.38 kg/m  Ideal Body Weight: Weight in (lb) to have BMI = 25: 171.4  Physical Exam  Constitutional: She is oriented to person, place, and time. She appears well-developed and well-nourished.  No distress.  HENT:  Head: Normocephalic and atraumatic.  Right Ear: External ear normal.  Left Ear: External ear normal.  Eyes: Conjunctivae and EOM are normal. Pupils are equal, round, and reactive to light.  Cardiovascular: Normal rate.   Pulmonary/Chest: Effort normal. No respiratory distress.  Musculoskeletal:       Lumbar back: She exhibits decreased range of motion (minimal with forward flexion), bony tenderness (at the SI), swelling and pain. She exhibits no edema, no deformity, no spasm and normal pulse.  Neurological: She is alert and oriented to person, place, and time.  Skin: She is not diaphoretic.  Psychiatric: She has a normal mood and affect. Her behavior is normal.     Assessment and Plan: Kristy BanSarah Hayhurst is a 35 y.o. female who is here today for low back pain.  Advised icing the area 3 times a day for 15 minutes. She will do stretches 3 times a day and follow it up with this icing. I've also encouraged her to use a back belt/brace when she is working with her husband. If she continues to have the back pain she will return.  We may also consider proceeding with a referral to ortho at that time. 1. Left-sided low back pain without sciatica - cyclobenzaprine (  FLEXERIL) 10 MG tablet; Take 0.5-1 tablets (5-10 mg total) by mouth 3 (three) times daily as needed for muscle spasms.  Dispense: 90 tablet; Rfill: 5 - meloxicam (MOBIC) 7.5 MG tablet; Take 1 tablet (7.5 mg total) by mouth daily. TAKE 1 TABLET BY  MOUTH EVERY DAY  Dispense: 90 tablet; Refill: 1   Trena Platt, PA-C [facility] 12/09/2015 10:33 PM

## 2015-12-28 DIAGNOSIS — Z79899 Other long term (current) drug therapy: Secondary | ICD-10-CM | POA: Diagnosis not present

## 2015-12-28 DIAGNOSIS — L709 Acne, unspecified: Secondary | ICD-10-CM | POA: Diagnosis not present

## 2016-01-18 ENCOUNTER — Ambulatory Visit (INDEPENDENT_AMBULATORY_CARE_PROVIDER_SITE_OTHER): Payer: BLUE CROSS/BLUE SHIELD | Admitting: Physician Assistant

## 2016-01-18 VITALS — BP 114/76 | HR 103 | Temp 99.0°F | Resp 16 | Ht 69.0 in | Wt 140.0 lb

## 2016-01-18 DIAGNOSIS — R102 Pelvic and perineal pain: Secondary | ICD-10-CM | POA: Diagnosis not present

## 2016-01-18 DIAGNOSIS — N898 Other specified noninflammatory disorders of vagina: Secondary | ICD-10-CM | POA: Diagnosis not present

## 2016-01-18 LAB — POCT URINE PREGNANCY: Preg Test, Ur: NEGATIVE

## 2016-01-18 LAB — POCT WET + KOH PREP
Trich by wet prep: ABSENT
Yeast by KOH: ABSENT
Yeast by wet prep: ABSENT

## 2016-01-18 LAB — POCT URINALYSIS DIP (MANUAL ENTRY)
Bilirubin, UA: NEGATIVE
Blood, UA: NEGATIVE
Glucose, UA: NEGATIVE
Ketones, POC UA: NEGATIVE
Leukocytes, UA: NEGATIVE
Nitrite, UA: NEGATIVE
Protein Ur, POC: NEGATIVE
Spec Grav, UA: <=1.005
Urobilinogen, UA: 0.2
pH, UA: 6

## 2016-01-18 LAB — POC MICROSCOPIC URINALYSIS (UMFC): Mucus: ABSENT

## 2016-01-18 NOTE — Progress Notes (Signed)
Kristy Petersen  MRN: [id number] DOB: 01-Feb-1981  PCP: No PCP Per Patient  Subjective:  Pt is a 35 year old female who presents to clinic for vaginal discharge.  Describes her discharge as dark orange x four days. Cannot describe the consistency. Notes associated abdominal pressure lower left side. Cannot describe it's location as it is "vague". Is not sharp or painful.  Denies urinary symptoms, fever, chills, pain with sex, itching, burning, pain, bad odor. Last sexual encounter was a few weeks ago. Did not use condom. One sexual partner.   Monistat two weeks for what she thought was a yeast infection.  LMP Feb. IUD in place since Feb.   Review of Systems  Constitutional: Negative for chills, diaphoresis, fatigue and fever.  Respiratory: Negative for cough, chest tightness and shortness of breath.   Cardiovascular: Negative for chest pain and palpitations.  Gastrointestinal: Positive for abdominal pain (lower ). Negative for diarrhea, nausea and vomiting.  Genitourinary: Positive for pelvic pain and vaginal discharge. Negative for difficulty urinating, dyspareunia, dysuria, frequency, hematuria, urgency, vaginal bleeding and vaginal pain.  Musculoskeletal: Negative for back pain.  Skin: Negative for color change and rash.  Neurological: Negative for dizziness, weakness, light-headedness, numbness and headaches.    Patient Active Problem List   Diagnosis Date Noted  . Chronic low back pain 05/23/2015    Current Outpatient Prescriptions on File Prior to Visit  Medication Sig Dispense Refill  . acetaminophen (TYLENOL) 325 MG tablet Take 650 mg by mouth every 6 (six) hours as needed. Reported on 04/06/2015    . cyclobenzaprine (FLEXERIL) 10 MG tablet Take 0.5-1 tablets (5-10 mg total) by mouth 3 (three) times daily as needed for muscle spasms. 90 tablet 5  . ISOtretinoin (CLARAVIS) 40 MG capsule Take 40 mg by mouth daily.    . meloxicam (MOBIC) 7.5 MG tablet Take 1 tablet (7.5 mg total)  by mouth daily. TAKE 1 TABLET BY  MOUTH EVERY DAY 90 tablet 1   No current facility-administered medications on file prior to visit.     No Known Allergies   Objective:  BP 114/76 (BP Location: Right Arm, Patient Position: Sitting, Cuff Size: Normal)   Pulse (!) 103   Temp 99 F (37.2 C)   Resp 16   Ht 5\' 9"  (1.753 m)   Wt 140 lb (63.5 kg)   SpO2 100%   BMI 20.67 kg/m   Physical Exam  Constitutional: She is oriented to person, place, and time and well-developed, well-nourished, and in no distress. No distress.  Cardiovascular: Normal rate, regular rhythm and normal heart sounds.   Pulmonary/Chest: Effort normal. No respiratory distress.  Genitourinary: Rectum normal, vagina normal, uterus normal, cervix normal, right adnexa normal, left adnexa normal and vulva normal. Uterus is not fixed and not tender. Right adnexum displays no tenderness. Left adnexum displays no tenderness. No vaginal discharge found.  Neurological: She is alert and oriented to person, place, and time. GCS score is 15.  Skin: Skin is warm and dry.  Psychiatric: Mood, memory, affect and judgment normal.  Vitals reviewed.  Results for orders placed or performed in visit on 01/18/16  POCT Microscopic Urinalysis ([facility])  Result Value Ref Range   WBC,UR,HPF,POC None None WBC/hpf   RBC,UR,HPF,POC None None RBC/hpf   Bacteria None None, Too numerous to count   Mucus Absent Absent   Epithelial Cells, UR Per Microscopy None None, Too numerous to count cells/hpf  POCT urinalysis dipstick  Result Value Ref Range   Color, UA  yellow yellow   Clarity, UA clear clear   Glucose, UA negative negative   Bilirubin, UA negative negative   Ketones, POC UA negative negative   Spec Grav, UA <=1.005    Blood, UA negative negative   pH, UA 6.0    Protein Ur, POC negative negative   Urobilinogen, UA 0.2    Nitrite, UA Negative Negative   Leukocytes, UA Negative Negative  POCT urine pregnancy  Result Value Ref Range    Preg Test, Ur Negative Negative  POCT Wet + KOH Prep  Result Value Ref Range   Yeast by KOH Absent Present, Absent   Yeast by wet prep Absent Present, Absent   WBC by wet prep None None, Few, Too numerous to count   Clue Cells Wet Prep HPF POC None None, Too numerous to count   Trich by wet prep Absent Present, Absent   Bacteria Wet Prep HPF POC None None, Few, Too numerous to count   Epithelial Cells By [employer] Pref ([facility]) Many (A) None, Few, Too numerous to count   RBC,UR,HPF,POC None None RBC/hpf    Assessment and Plan :  1. Vaginal discharge 2. Pelvic pain - POCT Microscopic Urinalysis ([facility]) - POCT urinalysis dipstick - POCT urine pregnancy - POCT Wet + KOH Prep - GC/Chlamydia Probe Amp - HIV antibody - Labs are normal. GC/Chlamydia and HIV still pending. Discharge and pain likely due to breakthrough bleeding and associated menstrual cramping. Discussed results and conclusion with patient. She understands. Will contact with lab results.   Marco CollieWhitney Jerred Zaremba, PA-C  [facility] 01/18/2016 2:09 PM

## 2016-01-18 NOTE — Patient Instructions (Addendum)
You will receive your lab results when they become available.  As of this moment, I think this is vaginal discharge associated with your IUD. While annoying, it is not concerning. If your cramping does not go away or if it gets worse, call or come back.   Thank you for coming in today. I hope you feel we met your needs.  Feel free to call [facility] if you have any questions or further requests.  Please consider signing up for MyChart if you do not already have it, as this is a great way to communicate with me.  Best,  Whitney McVey, PA-C   IF you received an x-ray today, you will receive an invoice from [facility]. Please contact [facility] at [phone] with questions or concerns regarding your invoice.   IF you received labwork today, you will receive an invoice from [employer]. Please contact [facility] at [phone] with questions or concerns regarding your invoice.   Our billing staff will not be able to assist you with questions regarding bills from these companies.  You will be contacted with the lab results as soon as they are available. The fastest way to get your results is to activate your My Chart account. Instructions are located on the last page of this paperwork. If you have not heard from [place] regarding the results in 2 weeks, please contact this office.     Metrorrhagia Metrorrhagia is bleeding from the uterus that happens irregularly but often. The bleeding generally happens between menstrual periods. HOME CARE INSTRUCTIONS Pay attention to any changes in your symptoms. Follow these instructions to help with your condition: Eating  Eat well-balanced meals. Include foods that are high in iron, such as liver, meat, shellfish, green leafy vegetables, and eggs.  If you become constipated:  Drink plenty of water.  Eat fruits and vegetables that are high in water and fiber, such as spinach, carrots, raspberries, apples, and  mango. Medicines  Take over-the-counter and prescription medicines only as told by your health care provider.  Do not change medicines without talking with your health care provider.  Aspirin or medicines that contain aspirin may make the bleeding worse. Do not take those medicines:  During the week before your period.  During your period.  If you were prescribed iron pills, take them as told by your health care provider. Iron pills help to replace iron that your body loses because of this condition. Activity  If you need to change your sanitary pad or tampon more than one time every 2 hours:  Lie in bed with your feet raised (elevated).  Place a cold pack on your lower abdomen.  Rest as much as possible until the bleeding stops or slows down.  Do not try to lose weight until the bleeding has stopped and your blood iron level is back to normal. Other Instructions  For two months, write down:  When your period starts.  When your period ends.  When any abnormal bleeding occurs.  What problems you notice.  Keep all follow-up visits as told by your health care provider. This is important. SEEK MEDICAL CARE IF:  You get light-headed or weak.  You have nausea and vomiting.  You cannot eat or drink without vomiting.  You feel dizzy or have diarrhea while you are taking medicine.  You are taking birth control pills or hormones, and you want to change them or stop taking them. SEEK IMMEDIATE MEDICAL CARE IF:  You develop a fever  or chills.  You need to change your sanitary pad or tampon more than one time per hour.  Your bleeding becomesheavy.  Your flow contains clots.  You develop pain in your abdomen.  You lose consciousness.  You develop a rash.   This information is not intended to replace advice given to you by your health care provider. Make sure you discuss any questions you have with your health care provider.   Document Released: 02/25/2005  Document Revised: 11/16/2014 Document Reviewed: 05/23/2014 Elsevier Interactive Patient Education [employer].

## 2016-01-19 LAB — HIV ANTIBODY (ROUTINE TESTING W REFLEX): HIV 1&2 Ab, 4th Generation: NONREACTIVE

## 2016-01-22 ENCOUNTER — Telehealth: Payer: Self-pay

## 2016-01-22 ENCOUNTER — Telehealth: Payer: Self-pay | Admitting: *Deleted

## 2016-01-22 DIAGNOSIS — R102 Pelvic and perineal pain: Secondary | ICD-10-CM

## 2016-01-22 DIAGNOSIS — N898 Other specified noninflammatory disorders of vagina: Secondary | ICD-10-CM

## 2016-01-22 LAB — GC/CHLAMYDIA PROBE AMP

## 2016-01-22 NOTE — Telephone Encounter (Signed)
Wrong swab was used in collecting for GC/Chlamydia test.  Please see if she is still symptomatic.  If she is then we just need to collect a urine (dirty catch)(future order placed).  Lmom to cb

## 2016-01-22 NOTE — Telephone Encounter (Signed)
Spoke with pt, she states she does not need to re collect specimen because she is no longer symptomatic.

## 2016-01-30 DIAGNOSIS — Z79899 Other long term (current) drug therapy: Secondary | ICD-10-CM | POA: Diagnosis not present

## 2016-01-30 DIAGNOSIS — L709 Acne, unspecified: Secondary | ICD-10-CM | POA: Diagnosis not present

## 2016-02-08 DIAGNOSIS — F339 Major depressive disorder, recurrent, unspecified: Secondary | ICD-10-CM | POA: Diagnosis not present

## 2016-02-08 DIAGNOSIS — F4010 Social phobia, unspecified: Secondary | ICD-10-CM | POA: Diagnosis not present

## 2016-02-08 DIAGNOSIS — F410 Panic disorder [episodic paroxysmal anxiety] without agoraphobia: Secondary | ICD-10-CM | POA: Diagnosis not present

## 2016-02-08 DIAGNOSIS — F4310 Post-traumatic stress disorder, unspecified: Secondary | ICD-10-CM | POA: Diagnosis not present

## 2016-02-15 DIAGNOSIS — F411 Generalized anxiety disorder: Secondary | ICD-10-CM | POA: Diagnosis not present

## 2016-02-15 DIAGNOSIS — F3181 Bipolar II disorder: Secondary | ICD-10-CM | POA: Diagnosis not present

## 2016-02-15 DIAGNOSIS — F4310 Post-traumatic stress disorder, unspecified: Secondary | ICD-10-CM | POA: Diagnosis not present

## 2016-02-15 DIAGNOSIS — F4010 Social phobia, unspecified: Secondary | ICD-10-CM | POA: Diagnosis not present

## 2016-02-20 DIAGNOSIS — F3181 Bipolar II disorder: Secondary | ICD-10-CM | POA: Diagnosis not present

## 2016-02-20 DIAGNOSIS — F4310 Post-traumatic stress disorder, unspecified: Secondary | ICD-10-CM | POA: Diagnosis not present

## 2016-02-20 DIAGNOSIS — F411 Generalized anxiety disorder: Secondary | ICD-10-CM | POA: Diagnosis not present

## 2016-02-20 DIAGNOSIS — F4010 Social phobia, unspecified: Secondary | ICD-10-CM | POA: Diagnosis not present

## 2016-02-29 DIAGNOSIS — L709 Acne, unspecified: Secondary | ICD-10-CM | POA: Diagnosis not present

## 2016-02-29 DIAGNOSIS — Z79899 Other long term (current) drug therapy: Secondary | ICD-10-CM | POA: Diagnosis not present

## 2016-03-14 DIAGNOSIS — F4310 Post-traumatic stress disorder, unspecified: Secondary | ICD-10-CM | POA: Diagnosis not present

## 2016-03-14 DIAGNOSIS — F4010 Social phobia, unspecified: Secondary | ICD-10-CM | POA: Diagnosis not present

## 2016-03-14 DIAGNOSIS — F411 Generalized anxiety disorder: Secondary | ICD-10-CM | POA: Diagnosis not present

## 2016-03-14 DIAGNOSIS — F3181 Bipolar II disorder: Secondary | ICD-10-CM | POA: Diagnosis not present

## 2016-03-22 DIAGNOSIS — F4310 Post-traumatic stress disorder, unspecified: Secondary | ICD-10-CM | POA: Diagnosis not present

## 2016-03-22 DIAGNOSIS — F411 Generalized anxiety disorder: Secondary | ICD-10-CM | POA: Diagnosis not present

## 2016-03-22 DIAGNOSIS — F3181 Bipolar II disorder: Secondary | ICD-10-CM | POA: Diagnosis not present

## 2016-03-22 DIAGNOSIS — F4010 Social phobia, unspecified: Secondary | ICD-10-CM | POA: Diagnosis not present

## 2016-04-01 DIAGNOSIS — F3181 Bipolar II disorder: Secondary | ICD-10-CM | POA: Diagnosis not present

## 2016-04-01 DIAGNOSIS — F4010 Social phobia, unspecified: Secondary | ICD-10-CM | POA: Diagnosis not present

## 2016-04-01 DIAGNOSIS — F411 Generalized anxiety disorder: Secondary | ICD-10-CM | POA: Diagnosis not present

## 2016-04-01 DIAGNOSIS — F4310 Post-traumatic stress disorder, unspecified: Secondary | ICD-10-CM | POA: Diagnosis not present

## 2016-04-02 DIAGNOSIS — L709 Acne, unspecified: Secondary | ICD-10-CM | POA: Diagnosis not present

## 2016-04-02 DIAGNOSIS — Z79899 Other long term (current) drug therapy: Secondary | ICD-10-CM | POA: Diagnosis not present

## 2016-04-15 DIAGNOSIS — F4310 Post-traumatic stress disorder, unspecified: Secondary | ICD-10-CM | POA: Diagnosis not present

## 2016-04-15 DIAGNOSIS — F4010 Social phobia, unspecified: Secondary | ICD-10-CM | POA: Diagnosis not present

## 2016-04-15 DIAGNOSIS — F411 Generalized anxiety disorder: Secondary | ICD-10-CM | POA: Diagnosis not present

## 2016-04-15 DIAGNOSIS — F3181 Bipolar II disorder: Secondary | ICD-10-CM | POA: Diagnosis not present

## 2016-04-24 ENCOUNTER — Other Ambulatory Visit: Payer: Self-pay | Admitting: Physician Assistant

## 2016-04-24 DIAGNOSIS — M545 Low back pain, unspecified: Secondary | ICD-10-CM

## 2016-04-25 NOTE — Telephone Encounter (Signed)
11/2015 last refill 

## 2016-04-29 DIAGNOSIS — F411 Generalized anxiety disorder: Secondary | ICD-10-CM | POA: Diagnosis not present

## 2016-04-29 DIAGNOSIS — F4010 Social phobia, unspecified: Secondary | ICD-10-CM | POA: Diagnosis not present

## 2016-04-29 DIAGNOSIS — F3181 Bipolar II disorder: Secondary | ICD-10-CM | POA: Diagnosis not present

## 2016-04-29 DIAGNOSIS — F4310 Post-traumatic stress disorder, unspecified: Secondary | ICD-10-CM | POA: Diagnosis not present

## 2016-05-03 DIAGNOSIS — L709 Acne, unspecified: Secondary | ICD-10-CM | POA: Diagnosis not present

## 2016-05-03 DIAGNOSIS — Z79899 Other long term (current) drug therapy: Secondary | ICD-10-CM | POA: Diagnosis not present

## 2016-05-07 DIAGNOSIS — F4010 Social phobia, unspecified: Secondary | ICD-10-CM | POA: Diagnosis not present

## 2016-05-07 DIAGNOSIS — F411 Generalized anxiety disorder: Secondary | ICD-10-CM | POA: Diagnosis not present

## 2016-05-07 DIAGNOSIS — F4310 Post-traumatic stress disorder, unspecified: Secondary | ICD-10-CM | POA: Diagnosis not present

## 2016-05-07 DIAGNOSIS — F3181 Bipolar II disorder: Secondary | ICD-10-CM | POA: Diagnosis not present

## 2016-05-13 DIAGNOSIS — F4310 Post-traumatic stress disorder, unspecified: Secondary | ICD-10-CM | POA: Diagnosis not present

## 2016-05-13 DIAGNOSIS — F3181 Bipolar II disorder: Secondary | ICD-10-CM | POA: Diagnosis not present

## 2016-05-13 DIAGNOSIS — F411 Generalized anxiety disorder: Secondary | ICD-10-CM | POA: Diagnosis not present

## 2016-05-13 DIAGNOSIS — F4010 Social phobia, unspecified: Secondary | ICD-10-CM | POA: Diagnosis not present

## 2016-05-15 ENCOUNTER — Other Ambulatory Visit: Payer: Self-pay | Admitting: Physician Assistant

## 2016-05-15 DIAGNOSIS — M545 Low back pain, unspecified: Secondary | ICD-10-CM

## 2016-05-26 ENCOUNTER — Other Ambulatory Visit: Payer: Self-pay | Admitting: Physician Assistant

## 2016-05-26 DIAGNOSIS — M545 Low back pain, unspecified: Secondary | ICD-10-CM

## 2016-06-04 DIAGNOSIS — F4010 Social phobia, unspecified: Secondary | ICD-10-CM | POA: Diagnosis not present

## 2016-06-04 DIAGNOSIS — F4310 Post-traumatic stress disorder, unspecified: Secondary | ICD-10-CM | POA: Diagnosis not present

## 2016-06-04 DIAGNOSIS — F3181 Bipolar II disorder: Secondary | ICD-10-CM | POA: Diagnosis not present

## 2016-06-04 DIAGNOSIS — F411 Generalized anxiety disorder: Secondary | ICD-10-CM | POA: Diagnosis not present

## 2016-06-18 DIAGNOSIS — F3181 Bipolar II disorder: Secondary | ICD-10-CM | POA: Diagnosis not present

## 2016-06-18 DIAGNOSIS — F4310 Post-traumatic stress disorder, unspecified: Secondary | ICD-10-CM | POA: Diagnosis not present

## 2016-06-18 DIAGNOSIS — F411 Generalized anxiety disorder: Secondary | ICD-10-CM | POA: Diagnosis not present

## 2016-06-18 DIAGNOSIS — F4010 Social phobia, unspecified: Secondary | ICD-10-CM | POA: Diagnosis not present

## 2016-07-02 DIAGNOSIS — F3181 Bipolar II disorder: Secondary | ICD-10-CM | POA: Diagnosis not present

## 2016-07-02 DIAGNOSIS — F4310 Post-traumatic stress disorder, unspecified: Secondary | ICD-10-CM | POA: Diagnosis not present

## 2016-07-02 DIAGNOSIS — F411 Generalized anxiety disorder: Secondary | ICD-10-CM | POA: Diagnosis not present

## 2016-07-02 DIAGNOSIS — F4010 Social phobia, unspecified: Secondary | ICD-10-CM | POA: Diagnosis not present

## 2016-07-04 DIAGNOSIS — Z681 Body mass index (BMI) 19 or less, adult: Secondary | ICD-10-CM | POA: Diagnosis not present

## 2016-07-04 DIAGNOSIS — Z01419 Encounter for gynecological examination (general) (routine) without abnormal findings: Secondary | ICD-10-CM | POA: Diagnosis not present

## 2016-07-23 ENCOUNTER — Other Ambulatory Visit: Payer: Self-pay | Admitting: Urgent Care

## 2016-07-23 DIAGNOSIS — M545 Low back pain, unspecified: Secondary | ICD-10-CM

## 2016-07-25 NOTE — Telephone Encounter (Signed)
05/2016 last refill 

## 2016-07-26 DIAGNOSIS — F4310 Post-traumatic stress disorder, unspecified: Secondary | ICD-10-CM | POA: Diagnosis not present

## 2016-07-26 DIAGNOSIS — F3181 Bipolar II disorder: Secondary | ICD-10-CM | POA: Diagnosis not present

## 2016-07-26 DIAGNOSIS — F4010 Social phobia, unspecified: Secondary | ICD-10-CM | POA: Diagnosis not present

## 2016-07-26 DIAGNOSIS — F411 Generalized anxiety disorder: Secondary | ICD-10-CM | POA: Diagnosis not present

## 2016-07-30 DIAGNOSIS — F3181 Bipolar II disorder: Secondary | ICD-10-CM | POA: Diagnosis not present

## 2016-07-30 DIAGNOSIS — F4310 Post-traumatic stress disorder, unspecified: Secondary | ICD-10-CM | POA: Diagnosis not present

## 2016-07-30 DIAGNOSIS — F411 Generalized anxiety disorder: Secondary | ICD-10-CM | POA: Diagnosis not present

## 2016-07-30 DIAGNOSIS — F4010 Social phobia, unspecified: Secondary | ICD-10-CM | POA: Diagnosis not present

## 2016-08-27 DIAGNOSIS — F4010 Social phobia, unspecified: Secondary | ICD-10-CM | POA: Diagnosis not present

## 2016-08-27 DIAGNOSIS — F411 Generalized anxiety disorder: Secondary | ICD-10-CM | POA: Diagnosis not present

## 2016-08-27 DIAGNOSIS — F3181 Bipolar II disorder: Secondary | ICD-10-CM | POA: Diagnosis not present

## 2016-08-27 DIAGNOSIS — F4310 Post-traumatic stress disorder, unspecified: Secondary | ICD-10-CM | POA: Diagnosis not present

## 2016-08-31 ENCOUNTER — Other Ambulatory Visit: Payer: Self-pay | Admitting: Physician Assistant

## 2016-08-31 DIAGNOSIS — M545 Low back pain, unspecified: Secondary | ICD-10-CM

## 2016-09-10 DIAGNOSIS — F411 Generalized anxiety disorder: Secondary | ICD-10-CM | POA: Diagnosis not present

## 2016-09-10 DIAGNOSIS — F4010 Social phobia, unspecified: Secondary | ICD-10-CM | POA: Diagnosis not present

## 2016-09-10 DIAGNOSIS — F3181 Bipolar II disorder: Secondary | ICD-10-CM | POA: Diagnosis not present

## 2016-09-10 DIAGNOSIS — F4310 Post-traumatic stress disorder, unspecified: Secondary | ICD-10-CM | POA: Diagnosis not present

## 2016-09-16 ENCOUNTER — Encounter: Payer: Self-pay | Admitting: Physician Assistant

## 2016-09-16 ENCOUNTER — Ambulatory Visit (INDEPENDENT_AMBULATORY_CARE_PROVIDER_SITE_OTHER): Payer: BLUE CROSS/BLUE SHIELD | Admitting: Physician Assistant

## 2016-09-16 VITALS — BP 133/86 | HR 97 | Temp 98.0°F | Resp 18 | Ht 69.69 in | Wt 141.6 lb

## 2016-09-16 DIAGNOSIS — N390 Urinary tract infection, site not specified: Secondary | ICD-10-CM | POA: Diagnosis not present

## 2016-09-16 DIAGNOSIS — R319 Hematuria, unspecified: Secondary | ICD-10-CM | POA: Diagnosis not present

## 2016-09-16 DIAGNOSIS — R300 Dysuria: Secondary | ICD-10-CM

## 2016-09-16 DIAGNOSIS — R350 Frequency of micturition: Secondary | ICD-10-CM

## 2016-09-16 LAB — POCT URINALYSIS DIP (MANUAL ENTRY)
BILIRUBIN UA: NEGATIVE
GLUCOSE UA: NEGATIVE mg/dL
Ketones, POC UA: NEGATIVE mg/dL
Nitrite, UA: POSITIVE — AB
Protein Ur, POC: 100 mg/dL — AB
SPEC GRAV UA: 1.02 (ref 1.010–1.025)
UROBILINOGEN UA: 0.2 U/dL
pH, UA: 6 (ref 5.0–8.0)

## 2016-09-16 MED ORDER — NITROFURANTOIN MONOHYD MACRO 100 MG PO CAPS: 100.0000 mg | ORAL_CAPSULE | Freq: Two times a day (BID) | ORAL | 0 refills | Status: AC

## 2016-09-16 MED ORDER — PHENAZOPYRIDINE HCL 200 MG PO TABS: 200.0000 mg | ORAL_TABLET | Freq: Three times a day (TID) | ORAL | 0 refills | Status: DC | PRN

## 2016-09-16 NOTE — Patient Instructions (Addendum)
  Your results indicate you have a UTI. I have given you a prescription for an antibiotic. Please take with food. I have sent off a urine culture and we should have those results in 48 hours. If your symptoms worsen while you are awaiting these results or you develop fever, chills, flank pian, nausea and vomiting, please seek care immediately.    Urinary Tract Infection, Adult A urinary tract infection (UTI) is an infection of any part of the urinary tract. The urinary tract includes the:  Kidneys.  Ureters.  Bladder.  Urethra.  These organs make, store, and get rid of pee (urine) in the body. Follow these instructions at home:  Take over-the-counter and prescription medicines only as told by your doctor.  If you were prescribed an antibiotic medicine, take it as told by your doctor. Do not stop taking the antibiotic even if you start to feel better.  Avoid the following drinks: ? Alcohol. ? Caffeine. ? Tea. ? Carbonated drinks.  Drink enough fluid to keep your pee clear or pale yellow.  Keep all follow-up visits as told by your doctor. This is important.  Make sure to: ? Empty your bladder often and completely. Do not to hold pee for long periods of time. ? Empty your bladder before and after sex. ? Wipe from front to back after a bowel movement if you are female. Use each tissue one time when you wipe. Contact a doctor if:  You have back pain.  You have a fever.  You feel sick to your stomach (nauseous).  You throw up (vomit).  Your symptoms do not get better after 3 days.  Your symptoms go away and then come back. Get help right away if:  You have very bad back pain.  You have very bad lower belly (abdominal) pain.  You are throwing up and cannot keep down any medicines or water. This information is not intended to replace advice given to you by your health care provider. Make sure you discuss any questions you have with your health care provider. Document  Released: 08/14/2007 Document Revised: 08/03/2015 Document Reviewed: 01/16/2015 Elsevier Interactive Patient Education  2018 Elsevier Inc.    IF you received an x-ray today, you will receive an invoice from [facility]. Please contact [facility] at [phone] with questions or concerns regarding your invoice.   IF you received labwork today, you will receive an invoice from [facility]. Please contact [facility] at [phone] with questions or concerns regarding your invoice.   Our billing staff will not be able to assist you with questions regarding bills from these companies.  You will be contacted with the lab results as soon as they are available. The fastest way to get your results is to activate your My Chart account. Instructions are located on the last page of this paperwork. If you have not heard from us regarding the results in 2 weeks, please contact this office.     

## 2016-09-16 NOTE — Progress Notes (Signed)
09/16/2016 at 4:13 PM  Kristy BanSarah Petersen / DOB: December 31, 1980 / MRN: [id number]  The patient has Chronic low back pain on her problem list.  SUBJECTIVE  Kristy Petersen is a 36 y.o. female who complains of dysuria, urinary frequency and urinary hesitancy x 2 days. She denies hematuria, flank pain, abdominal pain, cloudy malordorous urine and vaginal discharge. Has tried cranberry juice with no relief. Most recent UTI prior to this was years ago.   She  has a past medical history of Allergy and Anxiety.    Medications reviewed and updated by myself where necessary, and exist elsewhere in the encounter.   Kristy Petersen has No Known Allergies. She  reports that she quit smoking about 5 years ago. She has never used smokeless tobacco. She reports that she drinks about 4.2 oz of alcohol per week . She reports that she does not use drugs. She  reports that she currently engages in sexual activity. The patient  has no past surgical history on file.  Her family history includes Lupus in her mother.  Review of Systems  Constitutional: Negative for chills, diaphoresis and fever.  Gastrointestinal: Negative for nausea and vomiting.    OBJECTIVE  Her  height is 5' 9.69" (1.77 m) and weight is 141 lb 9.6 oz (64.2 kg). Her oral temperature is 98 F (36.7 C). Her blood pressure is 133/86 and her pulse is 97. Her respiration is 18 and oxygen saturation is 98%.  The patient's body mass index is 20.5 kg/m.  Physical Exam  Constitutional: She is oriented to person, place, and time. She appears well-developed and well-nourished. No distress.  HENT:  Head: Normocephalic and atraumatic.  Eyes: Conjunctivae are normal.  Neck: Normal range of motion.  Respiratory: Effort normal.  GI: There is no CVA tenderness.  Neurological: She is alert and oriented to person, place, and time.  Skin: Skin is warm and dry.  Psychiatric: She has a normal mood and affect.    Results for orders placed or performed in visit on 09/16/16  (from the past 24 hour(s))  POCT urinalysis dipstick     Status: Abnormal   Collection Time: 09/16/16  3:25 PM  Result Value Ref Range   Color, UA yellow yellow   Clarity, UA clear clear   Glucose, UA negative negative mg/dL   Bilirubin, UA negative negative   Ketones, POC UA negative negative mg/dL   Spec Grav, UA 4.0981.020 [id number] - 1.025   Blood, UA large (A) negative   pH, UA 6.0 5.0 - 8.0   Protein Ur, POC =100 (A) negative mg/dL   Urobilinogen, UA 0.2 0.2 or 1.0 E.U./dL   Nitrite, UA Positive (A) Negative   Leukocytes, UA Small (1+) (A) Negative    ASSESSMENT & PLAN  Kristy Petersen was seen today for dysuria, urinary retention and depression.  Diagnoses and all orders for this visit:  Urinary frequency -     POCT urinalysis dipstick -     POCT Microscopic Urinalysis ([facility]) -     Urine Culture  Dysuria -     phenazopyridine (PYRIDIUM) 200 MG tablet; Take 1 tablet (200 mg total) by mouth 3 (three) times daily as needed for pain.  Urinary tract infection with hematuria, site unspecified -     nitrofurantoin, macrocrystal-monohydrate, (MACROBID) 100 MG capsule; Take 1 capsule (100 mg total) by mouth 2 (two) times daily.    The patient was advised to call or come back to clinic if she does not see an improvement  in symptoms, or worsens with the above plan.   Benjiman Core, PA-C [facility] 09/16/2016 4:13 PM

## 2016-09-18 LAB — URINE CULTURE

## 2016-09-24 ENCOUNTER — Encounter: Payer: Self-pay | Admitting: Physician Assistant

## 2016-09-24 ENCOUNTER — Ambulatory Visit (INDEPENDENT_AMBULATORY_CARE_PROVIDER_SITE_OTHER): Payer: BLUE CROSS/BLUE SHIELD | Admitting: Physician Assistant

## 2016-09-24 VITALS — BP 121/78 | HR 87 | Temp 98.3°F | Resp 18 | Ht 69.13 in | Wt 141.0 lb

## 2016-09-24 DIAGNOSIS — Z87440 Personal history of urinary (tract) infections: Secondary | ICD-10-CM | POA: Diagnosis not present

## 2016-09-24 DIAGNOSIS — N390 Urinary tract infection, site not specified: Secondary | ICD-10-CM | POA: Diagnosis not present

## 2016-09-24 DIAGNOSIS — M545 Low back pain: Secondary | ICD-10-CM | POA: Diagnosis not present

## 2016-09-24 DIAGNOSIS — G8929 Other chronic pain: Secondary | ICD-10-CM | POA: Diagnosis not present

## 2016-09-24 DIAGNOSIS — R350 Frequency of micturition: Secondary | ICD-10-CM

## 2016-09-24 DIAGNOSIS — R319 Hematuria, unspecified: Secondary | ICD-10-CM

## 2016-09-24 LAB — POCT URINALYSIS DIP (MANUAL ENTRY)
BILIRUBIN UA: NEGATIVE
Glucose, UA: NEGATIVE mg/dL
Ketones, POC UA: NEGATIVE mg/dL
NITRITE UA: POSITIVE — AB
PH UA: 6.5 (ref 5.0–8.0)
Protein Ur, POC: 100 mg/dL — AB
SPEC GRAV UA: 1.025 (ref 1.010–1.025)
UROBILINOGEN UA: 1 U/dL

## 2016-09-24 LAB — POC MICROSCOPIC URINALYSIS (UMFC): Mucus: ABSENT

## 2016-09-24 MED ORDER — CYCLOBENZAPRINE HCL 10 MG PO TABS: 5.0000 mg | ORAL_TABLET | Freq: Three times a day (TID) | ORAL | 0 refills | Status: DC | PRN

## 2016-09-24 MED ORDER — CIPROFLOXACIN HCL 500 MG PO TABS: 500.0000 mg | ORAL_TABLET | Freq: Two times a day (BID) | ORAL | 0 refills | Status: DC

## 2016-09-24 MED ORDER — MELOXICAM 7.5 MG PO TABS: 7.5000 mg | ORAL_TABLET | Freq: Every day | ORAL | 0 refills | Status: DC

## 2016-09-24 NOTE — Progress Notes (Signed)
Kristy Petersen  MRN: [id number] DOB: 06/17/1980  Subjective:  Kristy Petersen is a 36 y.o. female seen in office today for a chief complaint of establish care.   Chronic conditions: 1) Depression and Anxiety: Has been seeing specialists since 2017. [facility] by the [facility] in [place]. Sees both a therapist and psychiatrist. Controlled on zoloft 111m daily, buspar 126mTID, and klonopin 0.56m61mrn.  2) Sleep disturbance: Controlled on trazodone 56m16m bedtime. Follwed by [facility].   3) Back Pain: Has had  low back pain for 3-4 years. She pulled it while lifting a heavy object years ago. She has had plain films of lumbar back in 2015 and it was normal. Has tried PT, yoga, and massage therapy with no full releif. Has been taking mobic 7.56mg 64m flexeril 56mg d20my for the past 3 years for this. Will occasionally have bilateral shooting pains down her buttocks. Denies numbness, tingling, saddle anesthesia, and bladder/bowel incontinence. No hx of GI bleed, ulcers, or heart disease. She has not been evaluated by a orthopedic for this issue.   Acute conditions:  She would also like to discuss her recent UTI as she is still having symptoms despite completing antibiotic. Was initally seen on 7/91/8 for urinary frequency, hesitancy, and dysuria x 2 days. UA dipstick showed nitrites and leukocytes to have urine repeated. Pt was given Rx for macrobid, which she completed. Notes she was feeling better after a couple days of taking macrobid but her urinary frequency has persisted. Denies dysuria, hematuria, flank pain, fever, chills, nausea, and vomiting. Urine culture grew E.coli, which was susceptible to macrobid.   Review of Systems  Constitutional: Negative for chills, fatigue and fever.  Gastrointestinal: Negative for abdominal pain, nausea and vomiting.  Genitourinary: Negative for flank pain, hematuria and vaginal discharge.    Patient Active Problem List   Diagnosis Date  Noted  . Chronic low back pain 05/23/2015    Current Outpatient Prescriptions on File Prior to Visit  Medication Sig Dispense Refill  . acetaminophen (TYLENOL) 325 MG tablet Take 650 mg by mouth every 6 (six) hours as needed. Reported on 04/06/2015    . busPIRone (BUSPAR) 10 MG tablet Take 10 mg by mouth 3 (three) times daily. Taking 2 tabs 3xdaily    . clonazePAM (KLONOPIN) 0.5 MG tablet Take 0.5 mg by mouth as needed for anxiety.    . phenazopyridine (PYRIDIUM) 200 MG tablet Take 1 tablet (200 mg total) by mouth 3 (three) times daily as needed for pain. 10 tablet 0   No current facility-administered medications on file prior to visit.     Allergies  Allergen Reactions  . Lamotrigine Hives     Objective:  BP 121/78 (BP Location: Right Arm, Patient Position: Sitting, Cuff Size: Normal)   Pulse 87   Temp 98.3 F (36.8 C) (Oral)   Resp 18   Ht 5' 9.13" (1.756 m)   Wt 141 lb (64 kg)   SpO2 97%   BMI 20.74 kg/m   Physical Exam  Constitutional: She is oriented to person, place, and time and well-developed, well-nourished, and in no distress.  HENT:  Head: Normocephalic and atraumatic.  Eyes: Conjunctivae are normal.  Neck: Normal range of motion.  Pulmonary/Chest: Effort normal.  Abdominal: Soft. Normal appearance. There is no tenderness. There is no CVA tenderness.  Musculoskeletal:       Cervical back: Normal.       Thoracic back: Normal.       Lumbar  back: She exhibits tenderness (in bilateral musculature) and spasm (in bilateral musculature). She exhibits no bony tenderness.  Neurological: She is alert and oriented to person, place, and time. She has normal sensation and normal strength. She has a normal Straight Leg Raise Test. Gait normal.  Reflex Scores:      Tricep reflexes are 2+ on the right side and 2+ on the left side.      Bicep reflexes are 2+ on the right side and 2+ on the left side.      Brachioradialis reflexes are 2+ on the right side and 2+ on the left  side.      Patellar reflexes are 2+ on the right side and 2+ on the left side.      Achilles reflexes are 2+ on the right side and 2+ on the left side. Skin: Skin is warm and dry.  Psychiatric: Affect normal.  Vitals reviewed.  Results for orders placed or performed in visit on 09/24/16 (from the past 24 hour(s))  POCT urinalysis dipstick     Status: Abnormal   Collection Time: 09/24/16  2:59 PM  Result Value Ref Range   Color, UA yellow yellow   Clarity, UA cloudy (A) clear   Glucose, UA negative negative mg/dL   Bilirubin, UA negative negative   Ketones, POC UA negative negative mg/dL   Spec Grav, UA 1.025 1.010 - 1.025   Blood, UA moderate (A) negative   pH, UA 6.5 5.0 - 8.0   Protein Ur, POC =100 (A) negative mg/dL   Urobilinogen, UA 1.0 0.2 or 1.0 E.U./dL   Nitrite, UA Positive (A) Negative   Leukocytes, UA Large (3+) (A) Negative  POCT Microscopic Urinalysis ([facility])     Status: Abnormal   Collection Time: 09/24/16  3:17 PM  Result Value Ref Range   WBC,UR,HPF,POC Too numerous to count  (A) None WBC/hpf   RBC,UR,HPF,POC Few (A) None RBC/hpf   Bacteria Too numerous to count  None, Too numerous to count   Mucus Absent Absent   Epithelial Cells, UR Per Microscopy Moderate (A) None, Too numerous to count cells/hpf    Assessment and Plan :  1. Chronic bilateral low back pain without sciatica Discussed with patient risks of long term NSAID use. Due to the duration of symptoms, she would likely benefit from orthopedic referral at this time. Given educational material for low back stretches.  - Ambulatory referral to Orthopedic Surgery - CMP14+EGFR - CBC with Differential/Platelet - meloxicam (MOBIC) 7.5 MG tablet; Take 1 tablet (7.5 mg total) by mouth daily.  Dispense: 90 tablet; Refill: 0 - cyclobenzaprine (FLEXERIL) 10 MG tablet; Take 0.5 tablets (5 mg total) by mouth 3 (three) times daily as needed for muscle spasms.  Dispense: 90 tablet; Refill: 0  2. Urinary frequency -  POCT urinalysis dipstick - POCT Microscopic Urinalysis ([facility]) - Urine Culture  3. Urinary tract infection with hematuria, site unspecified Pt's symptoms have improved, but she has not had full relief from tx with macrobid. UA dipstick and micro suggestive of UTI. Will treat with cipro at this time. Urine culture pending.  - ciprofloxacin (CIPRO) 500 MG tablet; Take 1 tablet (500 mg total) by mouth 2 (two) times daily.  Dispense: 10 tablet; Refill: 0  Tenna Delaine, PA-C  [facility] [place] 09/24/2016 10:45 PM

## 2016-09-24 NOTE — Patient Instructions (Addendum)
It does appear that you are still having a UTI. I am going to treat you with a stronger antibiotic for five days. I am also sending off another urine culture. We will contact you in a few days with the results. Please return or seek care immediately or if any of your symptoms worsen or you develop new concerning symptoms. In terms of back pain, the orthopedic will contact you within the next few weeks. Thank you for letting me participate in your health and well being.     IF you received an x-ray today, you will receive an [facility]. Please contact [facility] at [phone] with questions or concerns regarding your invoice.   IF you received labwork today, you will receive an invoice from [facility]. Please contact [facility] at [phone] with questions or concerns regarding your invoice.   Our billing staff will not be able to assist you with questions regarding bills from these companies.  You will be contacted with the lab results as soon as they are available. The fastest way to get your results is to activate your My Chart account. Instructions are located on the last page of this paperwork. If you have not heard from us regarding the results in 2 weeks, please contact this office.     Low Back Sprain Rehab Ask your health care provider which exercises are safe for you. Do exercises exactly as told by your health care provider and adjust them as directed. It is normal to feel mild stretching, pulling, tightness, or discomfort as you do these exercises, but you should stop right away if you feel sudden pain or your pain gets worse. Do not begin these exercises until told by your health care provider. Stretching and range of motion exercises These exercises warm up your muscles and joints and improve the movement and flexibility of your back. These exercises also help to relieve pain, numbness, and tingling. Exercise A: Lumbar rotation  1. Lie on your back  on a firm surface and bend your knees. 2. Straighten your arms out to your sides so each arm forms an "L" shape with a side of your body (a 90 degree angle). 3. Slowly move both of your knees to one side of your body until you feel a stretch in your lower back. Try not to let your shoulders move off of the floor. 4. Hold for __________ seconds. 5. Tense your abdominal muscles and slowly move your knees back to the starting position. 6. Repeat this exercise on the other side of your body. Repeat __________ times. Complete this exercise __________ times a day. Exercise B: Prone extension on elbows  1. Lie on your abdomen on a firm surface. 2. Prop yourself up on your elbows. 3. Use your arms to help lift your chest up until you feel a gentle stretch in your abdomen and your lower back. ? This will place some of your body weight on your elbows. If this is uncomfortable, try stacking pillows under your chest. ? Your hips should stay down, against the surface that you are lying on. Keep your hip and back muscles relaxed. 4. Hold for __________ seconds. 5. Slowly relax your upper body and return to the starting position. Repeat __________ times. Complete this exercise __________ times a day. Strengthening exercises These exercises build strength and endurance in your back. Endurance is the ability to use your muscles for a long time, even after they get tired. Exercise C: Pelvic tilt 1. Lie on  your back on a firm surface. Bend your knees and keep your feet flat. 2. Tense your abdominal muscles. Tip your pelvis up toward the ceiling and flatten your lower back into the floor. ? To help with this exercise, you may place a small towel under your lower back and try to push your back into the towel. 3. Hold for __________ seconds. 4. Let your muscles relax completely before you repeat this exercise. Repeat __________ times. Complete this exercise __________ times a day. Exercise D: Alternating arm and  leg raises  1. Get on your hands and knees on a firm surface. If you are on a hard floor, you may want to use padding to cushion your knees, such as an exercise mat. 2. Line up your arms and legs. Your hands should be below your shoulders, and your knees should be below your hips. 3. Lift your left leg behind you. At the same time, raise your right arm and straighten it in front of you. ? Do not lift your leg higher than your hip. ? Do not lift your arm higher than your shoulder. ? Keep your abdominal and back muscles tight. ? Keep your hips facing the ground. ? Do not arch your back. ? Keep your balance carefully, and do not hold your breath. 4. Hold for __________ seconds. 5. Slowly return to the starting position and repeat with your right leg and your left arm. Repeat __________ times. Complete this exercise __________ times a day. Exercise E: Abdominal set with straight leg raise  1. Lie on your back on a firm surface. 2. Bend one of your knees and keep your other leg straight. 3. Tense your abdominal muscles and lift your straight leg up, 4-6 inches (10-15 cm) off the ground. 4. Keep your abdominal muscles tight and hold for __________ seconds. ? Do not hold your breath. ? Do not arch your back. Keep it flat against the ground. 5. Keep your abdominal muscles tense as you slowly lower your leg back to the starting position. 6. Repeat with your other leg. Repeat __________ times. Complete this exercise __________ times a day. Posture and body mechanics  Body mechanics refers to the movements and positions of your body while you do your daily activities. Posture is part of body mechanics. Good posture and healthy body mechanics can help to relieve stress in your body's tissues and joints. Good posture means that your spine is in its natural S-curve position (your spine is neutral), your shoulders are pulled back slightly, and your head is not tipped forward. The following are general  guidelines for applying improved posture and body mechanics to your everyday activities. Standing   When standing, keep your spine neutral and your feet about hip-width apart. Keep a slight bend in your knees. Your ears, shoulders, and hips should line up.  When you do a task in which you stand in one place for a long time, place one foot up on a stable object that is 2-4 inches (5-10 cm) high, such as a footstool. This helps keep your spine neutral. Sitting   When sitting, keep your spine neutral and keep your feet flat on the floor. Use a footrest, if necessary, and keep your thighs parallel to the floor. Avoid rounding your shoulders, and avoid tilting your head forward.  When working at a desk or a computer, keep your desk at a height where your hands are slightly lower than your elbows. Slide your chair under your desk so you  are close enough to maintain good posture.  When working at a computer, place your monitor at a height where you are looking straight ahead and you do not have to tilt your head forward or downward to look at the screen. Resting   When lying down and resting, avoid positions that are most painful for you.  If you have pain with activities such as sitting, bending, stooping, or squatting (flexion-based activities), lie in a position in which your body does not bend very much. For example, avoid curling up on your side with your arms and knees near your chest (fetal position).  If you have pain with activities such as standing for a long time or reaching with your arms (extension-based activities), lie with your spine in a neutral position and bend your knees slightly. Try the following positions:  Lying on your side with a pillow between your knees.  Lying on your back with a pillow under your knees. Lifting   When lifting objects, keep your feet at least shoulder-width apart and tighten your abdominal muscles.  Bend your knees and hips and keep your spine  neutral. It is important to lift using the strength of your legs, not your back. Do not lock your knees straight out.  Always ask for help to lift heavy or awkward objects. This information is not intended to replace advice given to you by your health care provider. Make sure you discuss any questions you have with your health care provider. Document Released: 02/25/2005 Document Revised: 11/02/2015 Document Reviewed: 12/07/2014 Elsevier Interactive Patient Education  [employer].

## 2016-09-25 LAB — CBC WITH DIFFERENTIAL/PLATELET
BASOS ABS: 0 10*3/uL (ref 0.0–0.2)
Basos: 0 %
EOS (ABSOLUTE): 0.1 10*3/uL (ref 0.0–0.4)
Eos: 1 %
Hematocrit: 39.7 % (ref 34.0–46.6)
Hemoglobin: 12.8 g/dL (ref 11.1–15.9)
Immature Grans (Abs): 0 10*3/uL (ref 0.0–0.1)
Immature Granulocytes: 0 %
LYMPHS ABS: 2.1 10*3/uL (ref 0.7–3.1)
Lymphs: 23 %
MCH: 32.6 pg (ref 26.6–33.0)
MCHC: 32.2 g/dL (ref 31.5–35.7)
MCV: 101 fL — ABNORMAL HIGH (ref 79–97)
MONOS ABS: 0.6 10*3/uL (ref 0.1–0.9)
Monocytes: 6 %
NEUTROS ABS: 6.2 10*3/uL (ref 1.4–7.0)
Neutrophils: 70 %
PLATELETS: 430 10*3/uL — AB (ref 150–379)
RBC: 3.93 x10E6/uL (ref 3.77–5.28)
RDW: 13 % (ref 12.3–15.4)
WBC: 9 10*3/uL (ref 3.4–10.8)

## 2016-09-25 LAB — CMP14+EGFR
ALT: 13 IU/L (ref 0–32)
AST: 21 IU/L (ref 0–40)
Albumin/Globulin Ratio: 2 (ref 1.2–2.2)
Albumin: 4.7 g/dL (ref 3.5–5.5)
Alkaline Phosphatase: 59 IU/L (ref 39–117)
BUN/Creatinine Ratio: 23 (ref 9–23)
BUN: 12 mg/dL (ref 6–20)
Bilirubin Total: 0.3 mg/dL (ref 0.0–1.2)
CO2: 21 mmol/L (ref 20–29)
Calcium: 9.5 mg/dL (ref 8.7–10.2)
Chloride: 102 mmol/L (ref 96–106)
Creatinine, Ser: 0.52 mg/dL — ABNORMAL LOW (ref 0.57–1.00)
GFR calc Af Amer: 143 mL/min/{1.73_m2} (ref >59)
GFR calc non Af Amer: 124 mL/min/{1.73_m2} (ref >59)
Globulin, Total: 2.4 g/dL (ref 1.5–4.5)
Glucose: 70 mg/dL (ref 65–99)
Potassium: 4.4 mmol/L (ref 3.5–5.2)
Sodium: 141 mmol/L (ref 134–144)
Total Protein: 7.1 g/dL (ref 6.0–8.5)

## 2016-09-29 LAB — URINE CULTURE

## 2016-10-01 DIAGNOSIS — F4010 Social phobia, unspecified: Secondary | ICD-10-CM | POA: Diagnosis not present

## 2016-10-01 DIAGNOSIS — F3181 Bipolar II disorder: Secondary | ICD-10-CM | POA: Diagnosis not present

## 2016-10-01 DIAGNOSIS — F4310 Post-traumatic stress disorder, unspecified: Secondary | ICD-10-CM | POA: Diagnosis not present

## 2016-10-01 DIAGNOSIS — F411 Generalized anxiety disorder: Secondary | ICD-10-CM | POA: Diagnosis not present

## 2016-10-15 DIAGNOSIS — F411 Generalized anxiety disorder: Secondary | ICD-10-CM | POA: Diagnosis not present

## 2016-10-15 DIAGNOSIS — F3181 Bipolar II disorder: Secondary | ICD-10-CM | POA: Diagnosis not present

## 2016-10-15 DIAGNOSIS — F4310 Post-traumatic stress disorder, unspecified: Secondary | ICD-10-CM | POA: Diagnosis not present

## 2016-10-15 DIAGNOSIS — F4010 Social phobia, unspecified: Secondary | ICD-10-CM | POA: Diagnosis not present

## 2016-10-18 DIAGNOSIS — M545 Low back pain: Secondary | ICD-10-CM | POA: Diagnosis not present

## 2016-10-18 DIAGNOSIS — M5416 Radiculopathy, lumbar region: Secondary | ICD-10-CM | POA: Diagnosis not present

## 2016-10-25 DIAGNOSIS — F4310 Post-traumatic stress disorder, unspecified: Secondary | ICD-10-CM | POA: Diagnosis not present

## 2016-10-25 DIAGNOSIS — F3181 Bipolar II disorder: Secondary | ICD-10-CM | POA: Diagnosis not present

## 2016-10-25 DIAGNOSIS — F4010 Social phobia, unspecified: Secondary | ICD-10-CM | POA: Diagnosis not present

## 2016-10-25 DIAGNOSIS — F411 Generalized anxiety disorder: Secondary | ICD-10-CM | POA: Diagnosis not present

## 2016-10-29 DIAGNOSIS — F3181 Bipolar II disorder: Secondary | ICD-10-CM | POA: Diagnosis not present

## 2016-10-29 DIAGNOSIS — F4310 Post-traumatic stress disorder, unspecified: Secondary | ICD-10-CM | POA: Diagnosis not present

## 2016-10-29 DIAGNOSIS — F4010 Social phobia, unspecified: Secondary | ICD-10-CM | POA: Diagnosis not present

## 2016-10-29 DIAGNOSIS — F411 Generalized anxiety disorder: Secondary | ICD-10-CM | POA: Diagnosis not present

## 2016-10-31 DIAGNOSIS — M545 Low back pain: Secondary | ICD-10-CM | POA: Diagnosis not present

## 2016-11-04 DIAGNOSIS — M5416 Radiculopathy, lumbar region: Secondary | ICD-10-CM | POA: Diagnosis not present

## 2016-11-08 DIAGNOSIS — M5416 Radiculopathy, lumbar region: Secondary | ICD-10-CM | POA: Diagnosis not present

## 2016-11-18 ENCOUNTER — Encounter: Payer: Self-pay | Admitting: Physician Assistant

## 2016-11-18 ENCOUNTER — Ambulatory Visit (INDEPENDENT_AMBULATORY_CARE_PROVIDER_SITE_OTHER): Payer: BLUE CROSS/BLUE SHIELD

## 2016-11-18 ENCOUNTER — Ambulatory Visit (INDEPENDENT_AMBULATORY_CARE_PROVIDER_SITE_OTHER): Payer: BLUE CROSS/BLUE SHIELD | Admitting: Physician Assistant

## 2016-11-18 VITALS — BP 106/72 | HR 102 | Temp 98.6°F | Resp 18 | Ht 69.13 in | Wt 155.4 lb

## 2016-11-18 DIAGNOSIS — Z23 Encounter for immunization: Secondary | ICD-10-CM

## 2016-11-18 DIAGNOSIS — R82998 Other abnormal findings in urine: Secondary | ICD-10-CM

## 2016-11-18 DIAGNOSIS — R1013 Epigastric pain: Secondary | ICD-10-CM

## 2016-11-18 DIAGNOSIS — R8299 Other abnormal findings in urine: Secondary | ICD-10-CM | POA: Diagnosis not present

## 2016-11-18 LAB — POCT CBC
Granulocyte percent: 72.2 %G (ref 37–80)
HCT, POC: 34.4 % — AB (ref 37.7–47.9)
HEMOGLOBIN: 11.5 g/dL — AB (ref 12.2–16.2)
Lymph, poc: 1.5 (ref 0.6–3.4)
MCH: 32.9 pg — AB (ref 27–31.2)
MCHC: 33.4 g/dL (ref 31.8–35.4)
MCV: 98.4 fL — AB (ref 80–97)
MID (cbc): 0.4 (ref 0–0.9)
MPV: 6.2 fL (ref 0–99.8)
POC Granulocyte: 4.9 (ref 2–6.9)
POC LYMPH PERCENT: 22.4 %L (ref 10–50)
POC MID %: 5.4 %M (ref 0–12)
Platelet Count, POC: 285 10*3/uL (ref 142–424)
RBC: 3.49 M/uL — AB (ref 4.04–5.48)
RDW, POC: 13 %
WBC: 6.8 10*3/uL (ref 4.6–10.2)

## 2016-11-18 LAB — POCT URINALYSIS DIPSTICK
Bilirubin, UA: NEGATIVE
GLUCOSE UA: NEGATIVE
KETONES UA: NEGATIVE
Nitrite, UA: NEGATIVE
Protein, UA: NEGATIVE
SPEC GRAV UA: 1.01 (ref 1.010–1.025)
Urobilinogen, UA: 0.2 E.U./dL
pH, UA: 7 (ref 5.0–8.0)

## 2016-11-18 MED ORDER — RANITIDINE HCL 150 MG PO TABS: 150.0000 mg | ORAL_TABLET | Freq: Every day | ORAL | 0 refills | Status: DC

## 2016-11-18 MED ORDER — CEPHALEXIN 500 MG PO CAPS: 500.0000 mg | ORAL_CAPSULE | Freq: Four times a day (QID) | ORAL | 0 refills | Status: DC

## 2016-11-18 NOTE — Patient Instructions (Signed)
     IF you received an x-ray today, you will receive an invoice from [facility]. Please contact [facility] at [phone] with questions or concerns regarding your invoice.   IF you received labwork today, you will receive an invoice from [facility]. Please contact [facility] at [phone] with questions or concerns regarding your invoice.   Our billing staff will not be able to assist you with questions regarding bills from these companies.  You will be contacted with the lab results as soon as they are available. The fastest way to get your results is to activate your My Chart account. Instructions are located on the last page of this paperwork. If you have not heard from us regarding the results in 2 weeks, please contact this office.     

## 2016-11-18 NOTE — Progress Notes (Signed)
11/20/2016 9:30 AM   DOB: [birth year] / MRN: [id number]  SUBJECTIVE:  Kristy Petersen is a 36 y.o. female presenting for fever that started yesterday and tells me she had night seats as well flank pain.  Tells me that she has frequent UTI. Has been taking AZO, Cipro, and another ABX and has little success.  No history of renal nephrolithiasis. She has a mirena placed two years ago. Denies abnormal vaginal dishcharge.  She has one ten year old.  Denies dysuria, frequency, urgency.   She is allergic to lamotrigine.   She  has a past medical history of Allergy and Anxiety.    She  reports that she quit smoking about 5 years ago. She has never used smokeless tobacco. She reports that she drinks about 4.2 oz of alcohol per week . She reports that she does not use drugs. She  reports that she currently engages in sexual activity. The patient  has no past surgical history on file.  Her family history includes Lupus in her mother.  Review of Systems  Constitutional: Negative for chills, diaphoresis and fever.  Eyes: Negative.   Respiratory: Negative for cough, hemoptysis, sputum production, shortness of breath and wheezing.   Cardiovascular: Negative for chest pain, orthopnea and leg swelling.  Gastrointestinal: Positive for abdominal pain. Negative for blood in stool, constipation, diarrhea, heartburn, melena, nausea and vomiting.  Genitourinary: Positive for flank pain. Negative for dysuria, frequency, hematuria and urgency.  Skin: Negative for rash.  Neurological: Negative for dizziness, sensory change, speech change, focal weakness and headaches.    The problem list and medications were reviewed and updated by myself where necessary and exist elsewhere in the encounter.   OBJECTIVE:  BP 106/72   Pulse (!) 102   Temp 98.6 F (37 C) (Oral)   Resp 18   Ht 5' 9.13" (1.756 m)   Wt 155 lb 6.4 oz (70.5 kg)   SpO2 100%   BMI 22.86 kg/m   Physical Exam  Constitutional: She is oriented to  person, place, and time. She is active.  Non-toxic appearance.  Cardiovascular: Normal rate, regular rhythm, S1 normal, S2 normal, normal heart sounds and intact distal pulses.  Exam reveals no gallop, no friction rub and no decreased pulses.   No murmur heard. Pulmonary/Chest: Effort normal. No stridor. No tachypnea. No respiratory distress. She has no wheezes. She has no rales. She exhibits no tenderness.  Abdominal: Soft. Bowel sounds are normal. She exhibits distension. She exhibits no mass. There is no tenderness. There is no rebound and no guarding.  Musculoskeletal: She exhibits no edema.  Neurological: She is alert and oriented to person, place, and time. She has normal reflexes. No cranial nerve deficit. She exhibits normal muscle tone. Coordination normal.  Skin: Skin is warm and dry. She is not diaphoretic. No pallor.    Results for orders placed or performed in visit on 11/18/16 (from the past 72 hour(s))  CMP and Liver     Status: Abnormal   Collection Time: 11/18/16  5:27 PM  Result Value Ref Range   Glucose 84 65 - 99 mg/dL   BUN 8 6 - 20 mg/dL   Creatinine, Ser 7.820.43 (L) 0.57 - 1.00 mg/dL   GFR calc non Af Amer 132 >59 mL/min/1.73   GFR calc Af Amer 152 >59 mL/min/1.73   Sodium 130 (L) 134 - 144 mmol/L   Potassium 4.3 3.5 - 5.2 mmol/L   Chloride 94 (L) 96 - 106 mmol/L  CO2 22 20 - 29 mmol/L   Calcium 8.7 8.7 - 10.2 mg/dL   Total Protein 6.5 6.0 - 8.5 g/dL   Albumin 4.1 3.5 - 5.5 g/dL   Bilirubin Total <7.8 0.0 - 1.2 mg/dL   Bilirubin, Direct 2.95 0.00 - 0.40 mg/dL   Alkaline Phosphatase 64 39 - 117 IU/L   AST 25 0 - 40 IU/L   ALT 16 0 - 32 IU/L  POCT CBC     Status: Abnormal   Collection Time: 11/18/16  5:35 PM  Result Value Ref Range   WBC 6.8 4.6 - 10.2 K/uL   Lymph, poc 1.5 0.6 - 3.4   POC LYMPH PERCENT 22.4 10 - 50 %L   MID (cbc) 0.4 0 - 0.9   POC MID % 5.4 0 - 12 %M   POC Granulocyte 4.9 2 - 6.9   Granulocyte percent 72.2 37 - 80 %G   RBC 3.49 (A) 4.04 -  5.48 M/uL   Hemoglobin 11.5 (A) 12.2 - 16.2 g/dL   HCT, POC 62.1 (A) 30.8 - 47.9 %   MCV 98.4 (A) 80 - 97 fL   MCH, POC 32.9 (A) 27 - 31.2 pg   MCHC 33.4 31.8 - 35.4 g/dL   RDW, POC 65.7 %   Platelet Count, POC 285 142 - 424 K/uL   MPV 6.2 0 - 99.8 fL  POCT urinalysis dipstick     Status: Abnormal   Collection Time: 11/18/16  5:38 PM  Result Value Ref Range   Color, UA yellow    Clarity, UA clear    Glucose, UA negative    Bilirubin, UA negative    Ketones, UA negative    Spec Grav, UA 1.010 1.010 - 1.025   Blood, UA small    pH, UA 7.0 5.0 - 8.0   Protein, UA negative    Urobilinogen, UA 0.2 0.2 or 1.0 E.U./dL   Nitrite, UA negative    Leukocytes, UA Small (1+) (A) Negative   CBC Latest Ref Rng & Units 11/18/2016 09/24/2016 03/31/2014  WBC 4.6 - 10.2 K/uL 6.8 9.0 8.9  Hemoglobin 12.2 - 16.2 g/dL 11.5(A) 12.8 13.0  Hematocrit 37.7 - 47.9 % 34.4(A) 39.7 40.3  Platelets 150 - 379 x10E3/uL - 430(H) -      No results found.  ASSESSMENT AND PLAN:  Kristy Petersen was seen today for urinary tract infection, fever, night sweats and abdominal pain.  Diagnoses and all orders for this visit:  Epigastric pain: Constipation vs UTI vs MSK vs gastritis vs gallbladder.  Will start abx to cover for UTI and colace for constipation.  New but very mild anemia.  Will see her back within about 7 days for a recheck.  -     POCT urinalysis dipstick -     POCT CBC -     CMP and Liver -     DG Abd 2 Views; Future -     H. pylori breath test  Need for influenza vaccination -     Flu Vaccine QUAD 36+ mos IM    The patient is advised to call or return to clinic if she does not see an improvement in symptoms, or to seek the care of the closest emergency department if she worsens with the above plan.   Deliah Boston, MHS, PA-C Primary Care at [facility] 11/20/2016 9:30 AM

## 2016-11-19 LAB — CMP AND LIVER
ALBUMIN: 4.1 g/dL (ref 3.5–5.5)
ALK PHOS: 64 IU/L (ref 39–117)
ALT: 16 IU/L (ref 0–32)
AST: 25 IU/L (ref 0–40)
BUN: 8 mg/dL (ref 6–20)
Bilirubin Total: <0.2 mg/dL (ref 0.0–1.2)
Bilirubin, Direct: 0.06 mg/dL (ref 0.00–0.40)
CO2: 22 mmol/L (ref 20–29)
CREATININE: 0.43 mg/dL — AB (ref 0.57–1.00)
Calcium: 8.7 mg/dL (ref 8.7–10.2)
Chloride: 94 mmol/L — ABNORMAL LOW (ref 96–106)
GFR calc Af Amer: 152 mL/min/{1.73_m2} (ref >59)
GFR calc non Af Amer: 132 mL/min/{1.73_m2} (ref >59)
Glucose: 84 mg/dL (ref 65–99)
Potassium: 4.3 mmol/L (ref 3.5–5.2)
Sodium: 130 mmol/L — ABNORMAL LOW (ref 134–144)
Total Protein: 6.5 g/dL (ref 6.0–8.5)

## 2016-11-20 ENCOUNTER — Inpatient Hospital Stay (HOSPITAL_COMMUNITY)
Admission: EM | Admit: 2016-11-20 | Discharge: 2016-11-23 | DRG: 872 | Disposition: A | Discharge status: Home / Self Care | Payer: BLUE CROSS/BLUE SHIELD | Attending: Internal Medicine | Admitting: Internal Medicine

## 2016-11-20 ENCOUNTER — Emergency Department (HOSPITAL_COMMUNITY): Payer: BLUE CROSS/BLUE SHIELD

## 2016-11-20 ENCOUNTER — Encounter (HOSPITAL_COMMUNITY): Payer: Self-pay | Admitting: Emergency Medicine

## 2016-11-20 ENCOUNTER — Encounter: Payer: Self-pay | Admitting: Physician Assistant

## 2016-11-20 ENCOUNTER — Ambulatory Visit: Payer: BLUE CROSS/BLUE SHIELD | Admitting: Physician Assistant

## 2016-11-20 VITALS — BP 86/56 | HR 95 | Temp 99.0°F | Resp 17 | Ht 69.5 in | Wt 150.0 lb

## 2016-11-20 DIAGNOSIS — Z79899 Other long term (current) drug therapy: Secondary | ICD-10-CM | POA: Diagnosis not present

## 2016-11-20 DIAGNOSIS — Z791 Long term (current) use of non-steroidal anti-inflammatories (NSAID): Secondary | ICD-10-CM | POA: Diagnosis not present

## 2016-11-20 DIAGNOSIS — F419 Anxiety disorder, unspecified: Secondary | ICD-10-CM | POA: Diagnosis present

## 2016-11-20 DIAGNOSIS — R000 Tachycardia, unspecified: Secondary | ICD-10-CM | POA: Diagnosis not present

## 2016-11-20 DIAGNOSIS — Z832 Family history of diseases of the blood and blood-forming organs and certain disorders involving the immune mechanism: Secondary | ICD-10-CM

## 2016-11-20 DIAGNOSIS — N12 Tubulo-interstitial nephritis, not specified as acute or chronic: Secondary | ICD-10-CM

## 2016-11-20 DIAGNOSIS — I959 Hypotension, unspecified: Secondary | ICD-10-CM

## 2016-11-20 DIAGNOSIS — R6510 Systemic inflammatory response syndrome (SIRS) of non-infectious origin without acute organ dysfunction: Secondary | ICD-10-CM | POA: Diagnosis not present

## 2016-11-20 DIAGNOSIS — Z87440 Personal history of urinary (tract) infections: Secondary | ICD-10-CM

## 2016-11-20 DIAGNOSIS — B9620 Unspecified Escherichia coli [E. coli] as the cause of diseases classified elsewhere: Secondary | ICD-10-CM | POA: Diagnosis not present

## 2016-11-20 DIAGNOSIS — A4151 Sepsis due to Escherichia coli [E. coli]: Secondary | ICD-10-CM

## 2016-11-20 DIAGNOSIS — Z888 Allergy status to other drugs, medicaments and biological substances status: Secondary | ICD-10-CM

## 2016-11-20 DIAGNOSIS — R509 Fever, unspecified: Secondary | ICD-10-CM | POA: Diagnosis not present

## 2016-11-20 DIAGNOSIS — A419 Sepsis, unspecified organism: Secondary | ICD-10-CM | POA: Diagnosis not present

## 2016-11-20 DIAGNOSIS — R9431 Abnormal electrocardiogram [ECG] [EKG]: Secondary | ICD-10-CM | POA: Diagnosis not present

## 2016-11-20 DIAGNOSIS — N10 Acute tubulo-interstitial nephritis: Secondary | ICD-10-CM | POA: Diagnosis not present

## 2016-11-20 LAB — PREGNANCY, URINE: Preg Test, Ur: NEGATIVE

## 2016-11-20 LAB — CBC WITH DIFFERENTIAL/PLATELET
BASOS ABS: 0 10*3/uL (ref 0.0–0.1)
BASOS PCT: 0 %
EOS ABS: 0.1 10*3/uL (ref 0.0–0.7)
EOS PCT: 1 %
HCT: 31.4 % — ABNORMAL LOW (ref 36.0–46.0)
Hemoglobin: 10.5 g/dL — ABNORMAL LOW (ref 12.0–15.0)
LYMPHS PCT: 15 %
Lymphs Abs: 2.1 10*3/uL (ref 0.7–4.0)
MCH: 32.9 pg (ref 26.0–34.0)
MCHC: 33.4 g/dL (ref 30.0–36.0)
MCV: 98.4 fL (ref 78.0–100.0)
MONO ABS: 1.4 10*3/uL — AB (ref 0.1–1.0)
Monocytes Relative: 10 %
Neutro Abs: 10.4 10*3/uL — ABNORMAL HIGH (ref 1.7–7.7)
Neutrophils Relative %: 74 %
PLATELETS: 293 10*3/uL (ref 150–400)
RBC: 3.19 MIL/uL — AB (ref 3.87–5.11)
RDW: 12.5 % (ref 11.5–15.5)
WBC: 14 10*3/uL — AB (ref 4.0–10.5)

## 2016-11-20 LAB — COMPREHENSIVE METABOLIC PANEL WITH GFR
ALT: 15 U/L (ref 14–54)
AST: 34 U/L (ref 15–41)
Albumin: 2.9 g/dL — ABNORMAL LOW (ref 3.5–5.0)
Alkaline Phosphatase: 70 U/L (ref 38–126)
Anion gap: 6 (ref 5–15)
BUN: 9 mg/dL (ref 6–20)
CO2: 26 mmol/L (ref 22–32)
Calcium: 8 mg/dL — ABNORMAL LOW (ref 8.9–10.3)
Chloride: 105 mmol/L (ref 101–111)
Creatinine, Ser: 0.99 mg/dL (ref 0.44–1.00)
GFR calc Af Amer: >60 mL/min
GFR calc non Af Amer: >60 mL/min
Glucose, Bld: 105 mg/dL — ABNORMAL HIGH (ref 65–99)
Potassium: 4 mmol/L (ref 3.5–5.1)
Sodium: 137 mmol/L (ref 135–145)
Total Bilirubin: 1.5 mg/dL — ABNORMAL HIGH (ref 0.3–1.2)
Total Protein: 6.2 g/dL — ABNORMAL LOW (ref 6.5–8.1)

## 2016-11-20 LAB — URINE CULTURE

## 2016-11-20 LAB — I-STAT CG4 LACTIC ACID, ED: Lactic Acid, Venous: 0.61 mmol/L (ref 0.5–1.9)

## 2016-11-20 LAB — POCT CBC
Granulocyte percent: 83 %G — AB (ref 37–80)
HEMATOCRIT: 36.1 % — AB (ref 37.7–47.9)
HEMOGLOBIN: 12 g/dL — AB (ref 12.2–16.2)
LYMPH, POC: 1.7 (ref 0.6–3.4)
MCH, POC: 33.4 pg — AB (ref 27–31.2)
MCHC: 33.3 g/dL (ref 31.8–35.4)
MCV: 100.2 fL — AB (ref 80–97)
MID (cbc): 0.7 (ref 0–0.9)
MPV: 6.4 fL (ref 0–99.8)
POC GRANULOCYTE: 11.6 — AB (ref 2–6.9)
POC LYMPH PERCENT: 12.2 %L (ref 10–50)
POC MID %: 4.8 %M (ref 0–12)
Platelet Count, POC: 308 10*3/uL (ref 142–424)
RBC: 3.61 M/uL — AB (ref 4.04–5.48)
RDW, POC: 12.3 %
WBC: 14 10*3/uL — AB (ref 4.6–10.2)

## 2016-11-20 LAB — URINALYSIS, ROUTINE W REFLEX MICROSCOPIC
Bilirubin Urine: NEGATIVE
Glucose, UA: NEGATIVE mg/dL
Ketones, ur: NEGATIVE mg/dL
Leukocytes, UA: NEGATIVE
Nitrite: NEGATIVE
Protein, ur: 100 mg/dL — AB
Specific Gravity, Urine: 1.008 (ref 1.005–1.030)
pH: 6 (ref 5.0–8.0)

## 2016-11-20 MED ORDER — IOPAMIDOL (ISOVUE-300) INJECTION 61%
100.0000 mL | Freq: Once | INTRAVENOUS | Status: AC | PRN
  Administered 2016-11-20: 100 mL via INTRAVENOUS

## 2016-11-20 MED ORDER — SODIUM CHLORIDE 0.9 % IV BOLUS (SEPSIS)
250.0000 mL | Freq: Once | INTRAVENOUS | Status: AC
  Administered 2016-11-20: 250 mL via INTRAVENOUS

## 2016-11-20 MED ORDER — DEXTROSE 5 % IV SOLN
1.0000 g | Freq: Once | INTRAVENOUS | Status: DC
  Administered 2016-11-20: 1 g via INTRAVENOUS

## 2016-11-20 MED ORDER — IOPAMIDOL (ISOVUE-300) INJECTION 61%
INTRAVENOUS | Status: AC
  Filled 2016-11-20: qty 100

## 2016-11-20 MED ORDER — SODIUM CHLORIDE 0.9 % IV BOLUS (SEPSIS)
1000.0000 mL | Freq: Once | INTRAVENOUS | Status: AC
  Administered 2016-11-20: 1000 mL via INTRAVENOUS

## 2016-11-20 MED ORDER — DEXTROSE 5 % IV SOLN
1.0000 g | Freq: Once | INTRAVENOUS | Status: AC
  Administered 2016-11-21: 1 g via INTRAVENOUS
  Filled 2016-11-20: qty 10

## 2016-11-20 MED ORDER — MORPHINE SULFATE (PF) 2 MG/ML IV SOLN
2.0000 mg | Freq: Once | INTRAVENOUS | Status: AC
  Administered 2016-11-20: 2 mg via INTRAVENOUS
  Filled 2016-11-20: qty 1

## 2016-11-20 MED ORDER — KETOROLAC TROMETHAMINE 30 MG/ML IJ SOLN
30.0000 mg | Freq: Once | INTRAMUSCULAR | Status: AC
  Administered 2016-11-20: 30 mg via INTRAVENOUS
  Filled 2016-11-20: qty 1

## 2016-11-20 NOTE — ED Notes (Signed)
[facility] at [facility] to arrival.

## 2016-11-20 NOTE — ED Notes (Signed)
Family at bedside. 

## 2016-11-20 NOTE — Patient Instructions (Signed)
     IF you received an x-ray today, you will receive an invoice from [facility]. Please contact [facility] at [phone] with questions or concerns regarding your invoice.   IF you received labwork today, you will receive an invoice from [facility]. Please contact [facility] at [phone] with questions or concerns regarding your invoice.   Our billing staff will not be able to assist you with questions regarding bills from these companies.  You will be contacted with the lab results as soon as they are available. The fastest way to get your results is to activate your My Chart account. Instructions are located on the last page of this paperwork. If you have not heard from us regarding the results in 2 weeks, please contact this office.     

## 2016-11-20 NOTE — ED Notes (Signed)
Bed: WA01 Expected date:  Expected time:  Means of arrival:  Comments: EMS possible sepsis from Urgent Care

## 2016-11-20 NOTE — ED Notes (Signed)
Pt has been given regular ginger ale to drink at this time.

## 2016-11-20 NOTE — ED Notes (Signed)
ED Provider at bedside. 

## 2016-11-20 NOTE — ED Provider Notes (Signed)
[facility]-EMERGENCY DEPT Provider Note   CSN: [id number] Arrival date & time: 11/20/16  1850     History   Chief Complaint Chief Complaint  Patient presents with  . Abdominal Pain    HPI Kristy Petersen is a 36 y.o. female.  Pt presents to the ED today with abdominal pain, f/c, urinary sx.  Pt has had multiple UTIs recently.  She developed another UTI about 2 days ago and was treated with Keflex starting on 9/10.  Culture grew out E. Coli and was sensitive to Keflex (as well as to everything else).  The pt has taken those meds and is getting worse.  Pt said that she has high fevers at night and soaks the bed when it breaks.  She went back to urgent care today.  BP was low, so they gave her 2L IVFs and 1 g of rocephin there.  They called EMS who brought her to the ED.      Past Medical History:  Diagnosis Date  . Allergy   . Anxiety     Patient Active Problem List   Diagnosis Date Noted  . Chronic low back pain 05/23/2015    History reviewed. No pertinent surgical history.  OB History    No data available       Home Medications    Prior to Admission medications   Medication Sig Start Date End Date Taking? Authorizing Provider  cephALEXin (KEFLEX) 500 MG capsule Take 1 capsule (500 mg total) by mouth 4 (four) times daily. 11/18/16 11/28/16 Yes Ofilia Neaslark, Michael L, PA-C  clonazePAM (KLONOPIN) 0.5 MG tablet Take 0.5 mg by mouth 2 (two) times daily as needed for anxiety.    Yes [provider]  cyclobenzaprine (FLEXERIL) 10 MG tablet Take 0.5 tablets (5 mg total) by mouth 3 (three) times daily as needed for muscle spasms. 09/24/16  Yes Barnett AbuWiseman, [place] D, PA-C  Ibuprofen-Famotidine 800-26.6 MG TABS Take 1 tablet by mouth 2 (two) times daily as needed (pain).   Yes [provider]  levonorgestrel (MIRENA, 52 MG,) 20 MCG/24HR IUD Mirena 20 mcg/24 hr (5 years) intrauterine device  Take 1 device by intrauterine route.   Yes [provider]  ranitidine (ZANTAC)  150 MG tablet Take 1 tablet (150 mg total) by mouth at bedtime. 11/18/16  Yes Ofilia Neaslark, Michael L, PA-C  sertraline (ZOLOFT) 100 MG tablet Take 1 tablet by mouth daily   Yes [provider]  traZODone (DESYREL) 50 MG tablet Take 1 tablet by mouth at bedtime   Yes [provider]  acetaminophen (TYLENOL) 325 MG tablet Take 650 mg by mouth every 6 (six) hours as needed for moderate pain. Reported on 04/06/2015    [provider]    Family History Family History  Problem Relation Age of Onset  . Lupus Mother     Social History Social History  Substance Use Topics  . Smoking status: Former Smoker    Quit date: 08/14/2011  . Smokeless tobacco: Never Used  . Alcohol use 4.2 oz/week    7 Cans of beer per week     Allergies   Lamotrigine   Review of Systems Review of Systems  Gastrointestinal: Positive for abdominal pain.  Genitourinary: Positive for dysuria and frequency.  Musculoskeletal: Positive for back pain.  All other systems reviewed and are negative.    Physical Exam Updated Vital Signs BP 108/74   Pulse 82   Temp 97.9 F (36.6 C) (Oral)   Resp 18   Ht   (1.778 m)   Wt 65.3 kg (144 lb)   SpO2 100%   BMI 20.66 kg/m   Physical Exam  Constitutional: She is oriented to person, place, and time. She appears well-developed and well-nourished.  HENT:  Head: Normocephalic and atraumatic.  Right Ear: External ear normal.  Left Ear: External ear normal.  Nose: Nose normal.  Mouth/Throat: Oropharynx is clear and moist.  Eyes: Pupils are equal, round, and reactive to light. Conjunctivae and EOM are normal.  Neck: Normal range of motion. Neck supple.  Cardiovascular: Normal rate, regular rhythm, normal heart sounds and intact distal pulses.   Pulmonary/Chest: Effort normal and breath sounds normal.  Abdominal: Soft. Bowel sounds are normal.  Musculoskeletal: Normal range of motion.  Neurological: She is alert and oriented to person, place,  and time.  Skin: Skin is warm and dry.  Psychiatric: She has a normal mood and affect. Her behavior is normal. Judgment and thought content normal.  Nursing note and vitals reviewed.    ED Treatments / Results  Labs (all labs ordered are listed, but only abnormal results are displayed) Labs Reviewed  COMPREHENSIVE METABOLIC PANEL - Abnormal; Notable for the following:       Result Value   Glucose, Bld 105 (*)    Calcium 8.0 (*)    Total Protein 6.2 (*)    Albumin 2.9 (*)    Total Bilirubin 1.5 (*)    All other components within normal limits  CBC WITH DIFFERENTIAL/PLATELET - Abnormal; Notable for the following:    WBC 14.0 (*)    RBC 3.19 (*)    Hemoglobin 10.5 (*)    HCT 31.4 (*)    Neutro Abs 10.4 (*)    Monocytes Absolute 1.4 (*)    All other components within normal limits  URINALYSIS, ROUTINE W REFLEX MICROSCOPIC - Abnormal; Notable for the following:    Hgb urine dipstick SMALL (*)    Protein, ur 100 (*)    Bacteria, UA RARE (*)    Squamous Epithelial / LPF 0-5 (*)    All other components within normal limits  CULTURE, BLOOD (ROUTINE X 2)  CULTURE, BLOOD (ROUTINE X 2)  URINE CULTURE  PREGNANCY, URINE  I-STAT CG4 LACTIC ACID, ED    EKG  EKG Interpretation  Date/Time:  Wednesday November 20 2016 20:18:35 EDT Ventricular Rate:  83 PR Interval:    QRS Duration: 102 QT Interval:  410 QTC Calculation: 482 R Axis:   77 Text Interpretation:  Sinus rhythm Low voltage, precordial leads Borderline prolonged QT interval Confirmed by Jacalyn Lefevre [phone]) on 11/20/2016 8:26:04 PM       Radiology Ct Abdomen Pelvis W Contrast  Result Date: 11/20/2016 CLINICAL DATA:  Feeling unwell. On antibiotics for urinary tract infection. EXAM: CT ABDOMEN AND PELVIS WITH CONTRAST TECHNIQUE: Multidetector CT imaging of the abdomen and pelvis was performed using the standard protocol following bolus administration of intravenous contrast. CONTRAST:  ISOVUE-300 IOPAMIDOL  (ISOVUE-300) INJECTION 61% COMPARISON:  Abdominal radiograph November 18, 2016 FINDINGS: LOWER CHEST: Dependent atelectasis. Included heart size is normal. No pericardial effusion. HEPATOBILIARY: Liver and gallbladder are normal. PANCREAS: Normal. SPLEEN: Normal. ADRENALS/URINARY TRACT: Kidneys are orthotopic. Multifocal hypoenhancement RIGHT kidney, mild RIGHT perinephric fat stranding. No nephrolithiasis or hydronephrosis. Mild RIGHT urothelial enhancement. Urinary bladder is well distended and unremarkable. STOMACH/BOWEL: The stomach, small and large bowel are normal in course and caliber without inflammatory changes, sensitivity decreased without oral contrast. Moderate amount of retained large bowel stool. The appendix  is not discretely identified, however there are no inflammatory changes in the right lower quadrant. VASCULAR/LYMPHATIC: Aortoiliac vessels are normal in course and caliber. No lymphadenopathy by CT size criteria. REPRODUCTIVE: IUD in central uterus. OTHER: Small amount of free fluid in the pelvis is likely physiologic. MUSCULOSKELETAL: Nonacute. Mild sacroiliac osteoarthrosis. Severe L5-S1 degenerative disc resulting in severe LEFT L5-S1 neural foraminal narrowing. Small fat containing umbilical hernia. IMPRESSION: 1. RIGHT pyelonephritis.  No obstructive uropathy. 2. Moderate volume retained large bowel stool. No bowel obstruction. Electronically Signed   By: Awilda Metro M.D.   On: 11/20/2016 22:31    Procedures Procedures (including critical care time)  Medications Ordered in ED Medications  iopamidol (ISOVUE-300) 61 % injection (not administered)  morphine 2 MG/ML injection 2 mg (not administered)  sodium chloride 0.9 % bolus 1,000 mL (0 mLs Intravenous Stopped 11/20/16 2056)    And  sodium chloride 0.9 % bolus 1,000 mL (0 mLs Intravenous Stopped 11/20/16 2036)    And  sodium chloride 0.9 % bolus 250 mL (0 mLs Intravenous Stopped 11/20/16 2224)  ketorolac (TORADOL) 30 MG/ML  injection 30 mg (30 mg Intravenous Given 11/20/16 2014)  iopamidol (ISOVUE-300) 61 % injection 100 mL (100 mLs Intravenous Contrast Given 11/20/16 2215)     Initial Impression / Assessment and Plan / ED Course  I have reviewed the triage vital signs and the nursing notes.  Pertinent labs & imaging results that were available during my care of the patient were reviewed by me and considered in my medical decision making (see chart for details).     This patient meets SIRS Criteria and may be septic. SIRS = Systemic Inflammatory Response Syndrome  Best Practice Recommends:   Notify the nurse immediately to increase monitoring of patient.   The recent clinical data is shown below. Vitals:   11/20/16 2047 11/20/16 2100 11/20/16 2200 11/20/16 2230  BP: 112/79 93/82 110/81 108/74  Pulse: 78 82 82 85  Resp: Temp:      TempSrc:      SpO2: 97% 100% 100% 100%  Weight:      Height:       Pt's bp and HR has improved with IVFs.  Pt given 1 g rocephin pta at urgent care.  Dr. Maryfrances Bunnell requested an additional gram to make it 2 g total.   Pt d/w Dr. Maryfrances Bunnell for admission.  Final Clinical Impressions(s) / ED Diagnoses   Final diagnoses:  Pyelonephritis  Sepsis due to Escherichia coli [facility])    New Prescriptions New Prescriptions   No medications on file     Jacalyn Lefevre, MD 11/20/16 2302

## 2016-11-20 NOTE — ED Notes (Signed)
Patient made aware urine sample is needed. 

## 2016-11-20 NOTE — Progress Notes (Signed)
11/20/2016 6:20 PM   DOB: 1980/08/24 / MRN: [id number]  SUBJECTIVE:  Kristy Petersen is a 36 y.o. female presenting for recheck of symptoms.  Has been taking keflex for an E Coli mediated UTI and colace for mild constipation since I last saw her.  She is feelliing worse, today mostly with HA and extreme fatigue.  Has been trying to hydrate but has been unable to keep up.  Most recent labs have been largely unremarkable. She is not moving her bowels despite colace, however she denies abdominal pain today.  She associates fevers and chills.  A mild anemia was noted at her last CBC.  She was also started on famotidine for GERD and an H. Pylori is pending at this time.    She is allergic to lamotrigine.   She  has a past medical history of Allergy and Anxiety.    She  reports that she quit smoking about 5 years ago. She has never used smokeless tobacco. She reports that she drinks about 4.2 oz of alcohol per week . She reports that she does not use drugs. She  reports that she currently engages in sexual activity. The patient  has no past surgical history on file.  Her family history includes Lupus in her mother.  Review of Systems  Constitutional: Positive for fever, malaise/fatigue and weight loss. Negative for chills and diaphoresis.  Skin: Negative for itching and rash.  Neurological: Negative for weakness.    The problem list and medications were reviewed and updated by myself where necessary and exist elsewhere in the encounter.   OBJECTIVE:  BP (!) 86/56   Pulse 95   Temp 99 F (37.2 C) (Oral)   Resp 17   Ht 5' 9.5" (1.765 m)   Wt 150 lb (68 kg)   SpO2 98%   BMI 21.83 kg/m   Wt Readings from Last 3 Encounters:  11/20/16 150 lb (68 kg)  11/18/16 155 lb 6.4 oz (70.5 kg)  09/24/16 141 lb (64 kg)   Temp Readings from Last 3 Encounters:  11/20/16 99 F (37.2 C) (Oral)  11/18/16 98.6 F (37 C) (Oral)  09/24/16 98.3 F (36.8 C) (Oral)   BP Readings from Last 3 Encounters:    11/20/16 (!) 86/56  11/18/16 106/72  09/24/16 121/78   Pulse Readings from Last 3 Encounters:  11/20/16 95  11/18/16 (!) 102  09/24/16 87     Physical Exam  Constitutional:  Non-toxic appearance.  Cardiovascular: Normal rate, regular rhythm, S1 normal, S2 normal, normal heart sounds and intact distal pulses.  Exam reveals no gallop, no friction rub and no decreased pulses.   No murmur heard. Pulmonary/Chest: Effort normal. No stridor. No respiratory distress. She has no wheezes. She has no rales.  Abdominal: She exhibits no distension.  Musculoskeletal: She exhibits no edema.  Skin: Skin is warm and dry. She is not diaphoretic. No pallor.    Wt Readings from Last 3 Encounters:  11/20/16 150 lb (68 kg)  11/18/16 155 lb 6.4 oz (70.5 kg)  09/24/16 141 lb (64 kg)     Results for orders placed or performed in visit on 11/20/16 (from the past 72 hour(s))  POCT CBC     Status: Abnormal   Collection Time: 11/20/16  5:48 PM  Result Value Ref Range   WBC 14 (A) 4.6 - 10.2 K/uL   Lymph, poc 1.7 0.6 - 3.4   POC LYMPH PERCENT 12.2 10 - 50 %L   MID (cbc)  0.7 0 - 0.9   POC MID % 4.8 0 - 12 %M   POC Granulocyte 11.6 (A) 2 - 6.9   Granulocyte percent 83.0 (A) 37 - 80 %G   RBC 3.61 (A) 4.04 - 5.48 M/uL   Hemoglobin 12.0 (A) 12.2 - 16.2 g/dL   HCT, POC 16.1 (A) 09.6 - 47.9 %   MCV 100.2 (A) 80 - 97 fL   MCH, POC 33.4 (A) 27 - 31.2 pg   MCHC 33.3 31.8 - 35.4 g/dL   RDW, POC 04.5 %   Platelet Count, POC 308 142 - 424 K/uL   MPV 6.4 0 - 99.8 fL   CBC Latest Ref Rng & Units 11/20/2016 11/18/2016 09/24/2016  WBC 4.6 - 10.2 K/uL 14(A) 6.8 9.0  Hemoglobin 12.2 - 16.2 g/dL 12.0(A) 11.5(A) 12.8  Hematocrit 37.7 - 47.9 % 36.1(A) 34.4(A) 39.7  Platelets 150 - 379 x10E3/uL - - 430(H)     No results found.  ASSESSMENT AND PLAN:  Kristy Petersen was seen today for fever and headache.  Diagnoses and all orders for this visit:  SIRS (systemic inflammatory response syndrome) (HCC): Likely  urosepsis.  Culture shows sensitivity however this is clearly not working, or she has another unknown etiology.  I have given her two liters of fluids along with 1 gram of ceftriaxone in NS.  Her pressure has not responded to 2 liters IV bolus.  Of note she does take a lot of ibuprofen for a history of chronic back pain and had been having some GERD, which is why I started her on famotidine. Given her dehydration I do wonder if her CBC would be different if she were not so hemoconcentrated.     Dehydration -     Cancel: CBC  Hypotension, unspecified hypotension type -     POCT CBC    The patient is advised to call or return to clinic if she does not see an improvement in symptoms, or to seek the care of the closest emergency department if she worsens with the above plan.   Deliah Boston, MHS, PA-C Primary Care at [facility] 11/20/2016 6:20 PM

## 2016-11-20 NOTE — ED Triage Notes (Addendum)
Per EMS, patient from UC, diagnosed with UTI x2 days and reports she is "still feeling bad" despite taking abx as prescribed. Hx of same.  20 R AC  2L NS and 1g Rocephin at UC.  BP 102/60 HR 78 O2 100%

## 2016-11-21 ENCOUNTER — Encounter (HOSPITAL_COMMUNITY): Payer: Self-pay | Admitting: Family Medicine

## 2016-11-21 DIAGNOSIS — F419 Anxiety disorder, unspecified: Secondary | ICD-10-CM | POA: Diagnosis present

## 2016-11-21 DIAGNOSIS — A419 Sepsis, unspecified organism: Principal | ICD-10-CM

## 2016-11-21 DIAGNOSIS — A4151 Sepsis due to Escherichia coli [E. coli]: Secondary | ICD-10-CM

## 2016-11-21 DIAGNOSIS — N10 Acute tubulo-interstitial nephritis: Secondary | ICD-10-CM

## 2016-11-21 LAB — COMPREHENSIVE METABOLIC PANEL
ALT: 16 U/L (ref 14–54)
ANION GAP: 9 (ref 5–15)
AST: 18 U/L (ref 15–41)
Albumin: 3.2 g/dL — ABNORMAL LOW (ref 3.5–5.0)
Alkaline Phosphatase: 79 U/L (ref 38–126)
BUN: 6 mg/dL (ref 6–20)
CHLORIDE: 107 mmol/L (ref 101–111)
CO2: 23 mmol/L (ref 22–32)
Calcium: 8.6 mg/dL — ABNORMAL LOW (ref 8.9–10.3)
Creatinine, Ser: 0.49 mg/dL (ref 0.44–1.00)
GFR calc non Af Amer: >60 mL/min (ref >60)
Glucose, Bld: 97 mg/dL (ref 65–99)
Potassium: 3.5 mmol/L (ref 3.5–5.1)
SODIUM: 139 mmol/L (ref 135–145)
Total Bilirubin: <0.1 mg/dL — ABNORMAL LOW (ref 0.3–1.2)
Total Protein: 6.9 g/dL (ref 6.5–8.1)

## 2016-11-21 LAB — CBC
HCT: 35.3 % — ABNORMAL LOW (ref 36.0–46.0)
Hemoglobin: 11.8 g/dL — ABNORMAL LOW (ref 12.0–15.0)
MCH: 33.1 pg (ref 26.0–34.0)
MCHC: 33.4 g/dL (ref 30.0–36.0)
MCV: 98.9 fL (ref 78.0–100.0)
PLATELETS: 261 10*3/uL (ref 150–400)
RBC: 3.57 MIL/uL — AB (ref 3.87–5.11)
RDW: 12.5 % (ref 11.5–15.5)
WBC: 13.4 10*3/uL — ABNORMAL HIGH (ref 4.0–10.5)

## 2016-11-21 LAB — H. PYLORI BREATH TEST: H pylori Breath Test: NEGATIVE

## 2016-11-21 MED ORDER — PANTOPRAZOLE SODIUM 40 MG PO TBEC
40.0000 mg | DELAYED_RELEASE_TABLET | Freq: Every day | ORAL | Status: DC
  Administered 2016-11-21 – 2016-11-23 (×3): 40 mg via ORAL
  Filled 2016-11-21 (×3): qty 1

## 2016-11-21 MED ORDER — OXYCODONE HCL 5 MG PO TABS
5.0000 mg | ORAL_TABLET | ORAL | Status: DC | PRN
  Administered 2016-11-21 – 2016-11-22 (×7): 5 mg via ORAL
  Filled 2016-11-21 (×7): qty 1

## 2016-11-21 MED ORDER — POLYETHYLENE GLYCOL 3350 17 G PO PACK
17.0000 g | PACK | Freq: Two times a day (BID) | ORAL | Status: AC
Start: 2016-11-21 — End: 2016-11-23
  Administered 2016-11-21 – 2016-11-22 (×3): 17 g via ORAL
  Filled 2016-11-21 (×2): qty 1

## 2016-11-21 MED ORDER — DEXTROSE 5 % IV SOLN
2.0000 g | INTRAVENOUS | Status: DC
  Administered 2016-11-21 – 2016-11-22 (×2): 2 g via INTRAVENOUS
  Filled 2016-11-21 (×2): qty 2

## 2016-11-21 MED ORDER — ENOXAPARIN SODIUM 40 MG/0.4ML ~~LOC~~ SOLN
40.0000 mg | SUBCUTANEOUS | Status: DC
  Filled 2016-11-21: qty 0.4

## 2016-11-21 MED ORDER — ONDANSETRON HCL 4 MG PO TABS: 4.0000 mg | ORAL_TABLET | Freq: Four times a day (QID) | ORAL | Status: DC | PRN

## 2016-11-21 MED ORDER — TRAZODONE HCL 50 MG PO TABS
50.0000 mg | ORAL_TABLET | Freq: Every day | ORAL | Status: DC
  Administered 2016-11-21 – 2016-11-22 (×3): 50 mg via ORAL
  Filled 2016-11-21 (×3): qty 1

## 2016-11-21 MED ORDER — ACETAMINOPHEN 650 MG RE SUPP: 650.0000 mg | Freq: Four times a day (QID) | RECTAL | Status: DC | PRN

## 2016-11-21 MED ORDER — FAMOTIDINE 20 MG PO TABS
20.0000 mg | ORAL_TABLET | Freq: Every day | ORAL | Status: DC
  Administered 2016-11-21 – 2016-11-22 (×3): 20 mg via ORAL
  Filled 2016-11-21 (×3): qty 1

## 2016-11-21 MED ORDER — IBUPROFEN 200 MG PO TABS
600.0000 mg | ORAL_TABLET | ORAL | Status: DC | PRN
  Administered 2016-11-21 – 2016-11-23 (×5): 600 mg via ORAL
  Filled 2016-11-21 (×5): qty 3

## 2016-11-21 MED ORDER — SERTRALINE HCL 100 MG PO TABS
100.0000 mg | ORAL_TABLET | Freq: Every day | ORAL | Status: DC
  Administered 2016-11-21 – 2016-11-23 (×3): 100 mg via ORAL
  Filled 2016-11-21 (×3): qty 1

## 2016-11-21 MED ORDER — CLONAZEPAM 0.5 MG PO TABS: 0.5000 mg | ORAL_TABLET | Freq: Two times a day (BID) | ORAL | Status: DC | PRN

## 2016-11-21 MED ORDER — ONDANSETRON HCL 4 MG/2ML IJ SOLN: 4.0000 mg | Freq: Four times a day (QID) | INTRAMUSCULAR | Status: DC | PRN

## 2016-11-21 MED ORDER — SODIUM CHLORIDE 0.9 % IV SOLN
INTRAVENOUS | Status: DC
  Administered 2016-11-21 – 2016-11-22 (×3): via INTRAVENOUS

## 2016-11-21 MED ORDER — ACETAMINOPHEN 325 MG PO TABS
650.0000 mg | ORAL_TABLET | Freq: Four times a day (QID) | ORAL | Status: DC | PRN
  Administered 2016-11-21: 650 mg via ORAL
  Filled 2016-11-21: qty 2

## 2016-11-21 NOTE — Progress Notes (Signed)
PHARMACY NOTE -  ANTIBIOTIC RENAL DOSE ADJUSTMENT   Request received for Pharmacy to assist with antibiotic renal dose adjustment.  Patient has been initiated on Ceftriaxone 2gm iv q24hr  for UTI. SCr 0.99, estimated CrCl 81 ml/min Current dosage is appropriate and need for further dosage adjustment appears unlikely at present. Will sign off at this time.  Please reconsult if a change in clinical status warrants re-evaluation of dosage.

## 2016-11-21 NOTE — Progress Notes (Signed)
[facility] PROGRESS NOTE    Progress Note  Kristy Petersen  ZOX:[id number] DOB: [birth year] DOA: 11/20/2016 PCP: Magdalene River, PA-C     Brief Narrative:   Kristy Petersen is an 36 y.o. female past medical history significant for anxiety comes in with fever and chills that started out with this area several weeks ago leading to urinary frequency was treated with oral antibiotic but her symptoms recurred.  Assessment/Plan:   Sepsis (HCC) due to Acute pyelonephritis: Agree with IV Rocephin as urine culture on 11/18/2016 showed Escherichia coli pansensitive. Blood pressures ranging 113/82-90/66 with a stable heart rate, has remained afebrile, she intends to have mild leukocytosis. Ibuprofen for fever and pain. CT scan showed right acute pyelonephritis no stone or abscesses. Use narcotics as a backup try to focus on ibuprofen. Continue IV fluid hydration.  Anxiety: Continue current home regimen.  Elevated total bilirubin: Likely due to sepsis no resolved.   DVT prophylaxis: SCD Family Communication:None Disposition Plan/Barrier to D/C: unable to determine Code Status:     Code Status Orders        Start     Ordered   11/21/16 0153  Full code  Continuous     11/21/16 0153    Code Status History    Date Active Date Inactive Code Status Order ID Comments User Context   This patient has a current code status but no historical code status.        IV Access:    Peripheral IV   Procedures and diagnostic studies:   Ct Abdomen Pelvis W Contrast  Result Date: 11/20/2016 CLINICAL DATA:  Feeling unwell. On antibiotics for urinary tract infection. EXAM: CT ABDOMEN AND PELVIS WITH CONTRAST TECHNIQUE: Multidetector CT imaging of the abdomen and pelvis was performed using the standard protocol following bolus administration of intravenous contrast. CONTRAST:  ISOVUE-300 IOPAMIDOL (ISOVUE-300) INJECTION 61% COMPARISON:  Abdominal radiograph November 18, 2016  FINDINGS: LOWER CHEST: Dependent atelectasis. Included heart size is normal. No pericardial effusion. HEPATOBILIARY: Liver and gallbladder are normal. PANCREAS: Normal. SPLEEN: Normal. ADRENALS/URINARY TRACT: Kidneys are orthotopic. Multifocal hypoenhancement RIGHT kidney, mild RIGHT perinephric fat stranding. No nephrolithiasis or hydronephrosis. Mild RIGHT urothelial enhancement. Urinary bladder is well distended and unremarkable. STOMACH/BOWEL: The stomach, small and large bowel are normal in course and caliber without inflammatory changes, sensitivity decreased without oral contrast. Moderate amount of retained large bowel stool. The appendix is not discretely identified, however there are no inflammatory changes in the right lower quadrant. VASCULAR/LYMPHATIC: Aortoiliac vessels are normal in course and caliber. No lymphadenopathy by CT size criteria. REPRODUCTIVE: IUD in central uterus. OTHER: Small amount of free fluid in the pelvis is likely physiologic. MUSCULOSKELETAL: Nonacute. Mild sacroiliac osteoarthrosis. Severe L5-S1 degenerative disc resulting in severe LEFT L5-S1 neural foraminal narrowing. Small fat containing umbilical hernia. IMPRESSION: 1. RIGHT pyelonephritis.  No obstructive uropathy. 2. Moderate volume retained large bowel stool. No bowel obstruction. Electronically Signed   By: Awilda Metro M.D.   On: 11/20/2016 22:31     Medical Consultants:    None.  Anti-Infectives:   Rocephin  Subjective:    Kristy Petersen continues to have fever and flank pain.  Objective:    Vitals:   11/20/16 2230 11/20/16 2300 11/21/16 0104 11/21/16 0710  BP: 108/74 113/82 90/66 92/60   Pulse: 85 85 77 68  Resp: Temp:   97.7 F (36.5 C) 98 F (36.7 C)  TempSrc:   Axillary Oral  SpO2: 100% 100% 100% 100%  Weight:      Height:        Intake/Output Summary (Last 24 hours) at 11/21/16 0746 Last data filed at 11/21/16 0600  Gross per 24 hour  Intake              315 ml    Output                0 ml  Net              315 ml   Filed Weights   11/20/16 1944  Weight: 65.3 kg (144 lb)    Exam: General exam: In no acute distress. Respiratory system: Good air movement and clear to auscultation. Cardiovascular system: S1 & S2 heard, RRR. No JVD, murmurs, rubs, gallops or clicks.  Gastrointestinal system: Positive bowel sounds mild suprapubic tenderness. Significant CVA tenderness. Central nervous system: Alert and oriented. No focal neurological deficits. Extremities: No pedal edema. Skin: No rashes, lesions or ulcers Psychiatry: Judgement and insight appear normal. Mood & affect appropriate.    Data Reviewed:    Labs: Basic Metabolic Panel:  Recent Labs Lab 11/18/16 1727 11/20/16 1936 11/21/16 0611  NA 130* 137 139  K 4.3 4.0 3.5  CL 94* 105 107  CO2 GLUCOSE 84 105* 97  BUN CREATININE 0.43* 0.99 0.49  CALCIUM 8.7 8.0* 8.6*   GFR Estimated Creatinine Clearance: 101.2 mL/min (by C-G formula based on SCr of 0.49 mg/dL). Liver Function Tests:  Recent Labs Lab 11/18/16 1727 11/20/16 1936 11/21/16 0611  AST 25 34 18  ALT ALKPHOS 64 70 79  BILITOT <0.2 1.5* <0.1*  PROT 6.5 6.2* 6.9  ALBUMIN 4.1 2.9* 3.2*   No results for input(s): LIPASE, AMYLASE in the last 168 hours. No results for input(s): AMMONIA in the last 168 hours. Coagulation profile No results for input(s): INR, PROTIME in the last 168 hours.  CBC:  Recent Labs Lab 11/18/16 1735 11/20/16 1748 11/20/16 1936 11/21/16 0611  WBC 6.8 14* 14.0* 13.4*  NEUTROABS  --   --  10.4*  --   HGB 11.5* 12.0* 10.5* 11.8*  HCT 34.4* 36.1* 31.4* 35.3*  MCV 98.4* 100.2* 98.4 98.9  PLT  --   --  293 261   Cardiac Enzymes: No results for input(s): CKTOTAL, CKMB, CKMBINDEX, TROPONINI in the last 168 hours. BNP (last 3 results) No results for input(s): PROBNP in the last 8760 hours. CBG: No results for input(s): GLUCAP in the last 168  hours. D-Dimer: No results for input(s): DDIMER in the last 72 hours. Hgb A1c: No results for input(s): HGBA1C in the last 72 hours. Lipid Profile: No results for input(s): CHOL, HDL, LDLCALC, TRIG, CHOLHDL, LDLDIRECT in the last 72 hours. Thyroid function studies: No results for input(s): TSH, T4TOTAL, T3FREE, THYROIDAB in the last 72 hours.  Invalid input(s): FREET3 Anemia work up: No results for input(s): VITAMINB12, FOLATE, FERRITIN, TIBC, IRON, RETICCTPCT in the last 72 hours. Sepsis Labs:  Recent Labs Lab 11/18/16 1735 11/20/16 1748 11/20/16 1936 11/20/16 1940 11/21/16 0611  WBC 6.8 14* 14.0*  --  13.4*  LATICACIDVEN  --   --   --  0.61  --    Microbiology Recent Results (from the past 240 hour(s))  Urine Culture     Status: Abnormal   Collection Time: 11/18/16  5:46 PM  Result Value Ref Range Status   Urine Culture, Routine Final report (A)  Final  Organism ID, Bacteria Escherichia coli (A)  Final    Comment: Greater than 100,000 colony forming units per mL Cefazolin <=4 ug/mL Cefazolin with an MIC <=16 predicts susceptibility to the oral agents cefaclor, cefdinir, cefpodoxime, cefprozil, cefuroxime, cephalexin, and loracarbef when used for therapy of uncomplicated urinary tract infections due to E. coli, Klebsiella pneumoniae, and Proteus mirabilis.    Antimicrobial Susceptibility Comment  Final    Comment:       ** S = Susceptible; I = Intermediate; R = Resistant **                    P = Positive; N = Negative             MICS are expressed in micrograms per mL    Antibiotic                 RSLT#1    RSLT#2    RSLT#3    RSLT#4 Amoxicillin/Clavulanic Acid    S Ampicillin                     S Cefepime                       S Ceftriaxone                    S Cefuroxime                     S Ciprofloxacin                  S Ertapenem                      S Gentamicin                     S Imipenem                       S Levofloxacin                    S Meropenem                      S Nitrofurantoin                 S Piperacillin/Tazobactam        S Tetracycline                   S Tobramycin                     S Trimethoprim/Sulfa             S      Medications:   . enoxaparin (LOVENOX) injection  40 mg Subcutaneous Q24H  . famotidine  20 mg Oral QHS  . iopamidol      . sertraline  100 mg Oral Daily  . traZODone  50 mg Oral QHS   Continuous Infusions: . sodium chloride 100 mL/hr at 11/21/16 0403  . cefTRIAXone (ROCEPHIN)  IV        LOS: 1 day   Marinda ElkFELIZ ORTIZ, ABRAHAM  [facility] Pager [phone]  *Please refer to [email or web address], password [email or web address] to get updated schedule on who will round on this patient, as hospitalists switch teams weekly. If 7PM-7AM, please contact night-coverage at [email or web address], password [email or web address] for any overnight needs.  11/21/2016,  7:46 AM

## 2016-11-21 NOTE — H&P (Signed)
History and Physical  Patient Name: Kristy Petersen     ZOX:[id number]    DOB: 08-07-1980    DOA: 11/20/2016 PCP: Magdalene River, PA-C  Patient coming from: Home --> UC --> [facility]  Chief Complaint: Flank pain, fever chills      HPI: Kristy Petersen is a 36 y.o. female with a past medical history significant for anxiety who presents with flank pain, fever and chills.  The patient was in her usual state of health until about a month ago when she got dysuria, urinary frequency.  Was treated with oral antibiotics, got better.  She then had recurrent symptoms over and over this month (she thinks total of three courses of PO antibiotics), culminating this past week when she got Cephalexin for a pan-sensitive E coli UTI (records in [place]).  She took this antibiotic, but over the weekend, she started to develop flank pain on her right, moderate, aching.  This began to be associated with fevers, chills, drenching sweats at night and malaise.  Today, she went to urgent care where she was tachycardic, had leukocytosis and BP <90/60 and was given 1L NS and ceftriaxone 1g and sent to the ER.  ED course: -Afebrile, heart rate 85, respiraitons and pulse ox normal, BP 88/70 -Na 137, K 4.0, Cr 0.99, WBC 14K, Hgb 12.2 -pregnancy test negative -Lactate 0.6 -UA showed 6-30 WBC and bacteria -CT abdomen showed no hydronephrois or stone, but did showed enhancement of the right kidney suggesting pyelonephritis -She was given an additional fluid bolus to reach 30/cc/kg, cultures were obtained of blood and urine and TRH were aske dto evaluate     ROS: Review of Systems  Constitutional: Positive for chills, diaphoresis, fever and malaise/fatigue.  Genitourinary: Positive for flank pain.  All other systems reviewed and are negative.         Past Medical History:  Diagnosis Date  . Allergy   . Anxiety     History reviewed. No pertinent surgical history.  Social History: Patient lives with her parents, she  is separated recently from her husband.  The patient walks unassisted.  She works at [employer].  Nonsmoker.  No IVDU.  Rare alcohol.  Allergies  Allergen Reactions  . Lamotrigine Hives    Family history: family history includes Crohn's disease in her father; Lupus in her mother.  Prior to Admission medications   Medication Sig Start Date End Date Taking? Authorizing Provider  clonazePAM (KLONOPIN) 0.5 MG tablet Take 0.5 mg by mouth 2 (two) times daily as needed for anxiety.    Yes [provider]  cyclobenzaprine (FLEXERIL) 10 MG tablet Take 0.5 tablets (5 mg total) by mouth 3 (three) times daily as needed for muscle spasms. 09/24/16  Yes Barnett Abu, [place] D, PA-C  Ibuprofen-Famotidine 800-26.6 MG TABS Take 1 tablet by mouth 2 (two) times daily as needed (pain).   Yes [provider]  levonorgestrel (MIRENA, 52 MG,) 20 MCG/24HR IUD Mirena 20 mcg/24 hr (5 years) intrauterine device  Take 1 device by intrauterine route.   Yes [provider]  ranitidine (ZANTAC) 150 MG tablet Take 1 tablet (150 mg total) by mouth at bedtime. 11/18/16  Yes Ofilia Neas, PA-C  sertraline (ZOLOFT) 100 MG tablet Take 1 tablet by mouth daily   Yes [provider]  traZODone (DESYREL) 50 MG tablet Take 1 tablet by mouth at bedtime   Yes [provider]  acetaminophen (TYLENOL) 325 MG tablet Take 650 mg by mouth every 6 (six) hours  as needed for moderate pain. Reported on 04/06/2015    [provider]       Physical Exam: BP 90/66 (BP Location: Left Arm)   Pulse 77   Temp 97.7 F (36.5 C) (Axillary)   Resp 15   Ht  (1.778 m)   Wt 65.3 kg (144 lb)   SpO2 100%   BMI 20.66 kg/m  General appearance: Well-developed, adult female, alert and in no acute distress.   Eyes: Anicteric, conjunctiva pink, lids and lashes normal. PERRL.    ENT: No nasal deformity, discharge, epistaxis.  Hearing normal. OP moist without lesions.   Neck: No neck masses.   Trachea midline.  No thyromegaly/tenderness. Lymph: No cervical or supraclavicular lymphadenopathy. Skin: Warm and dry.  No jaundice.  No suspicious rashes or lesions. Cardiac: RRR, nl S1-S2, no murmurs appreciated.  Capillary refill is brisk.  JVP normal.  No LE edema.  Radial and DP pulses 2+ and symmetric. Respiratory: Normal respiratory rate and rhythm.  CTAB without rales or wheezes. Abdomen: Abdomen soft.  No TTP. No ascites, distension, hepatosplenomegaly.   MSK: No deformities or effusions.  No cyanosis or clubbing.  Right costovertebral tenderness noted. Neuro: Cranial nerves normal.  Sensation intact to light touch. Speech is fluent.  Muscle strength normal.    Psych: Sensorium intact and responding to questions, attention normal.  Behavior appropriate.  Affect noraml.  Judgment and insight appear normal.     Labs on Admission:  I have personally reviewed following labs and imaging studies: CBC:  Recent Labs Lab 11/18/16 1735 11/20/16 1748 11/20/16 1936  WBC 6.8 14* 14.0*  NEUTROABS  --   --  10.4*  HGB 11.5* 12.0* 10.5*  HCT 34.4* 36.1* 31.4*  MCV 98.4* 100.2* 98.4  PLT  --   --  293   Basic Metabolic Panel:  Recent Labs Lab 11/18/16 1727 11/20/16 1936  NA 130* 137  K 4.3 4.0  CL 94* 105  CO2 22 26  GLUCOSE 84 105*  BUN 8 9  CREATININE 0.43* 0.99  CALCIUM 8.7 8.0*   GFR: Estimated Creatinine Clearance: 81.8 mL/min (by C-G formula based on SCr of 0.99 mg/dL).  Liver Function Tests:  Recent Labs Lab 11/18/16 1727 11/20/16 1936  AST 25 34  ALT 16 15  ALKPHOS 64 70  BILITOT <0.2 1.5*  PROT 6.5 6.2*  ALBUMIN 4.1 2.9*   No results for input(s): LIPASE, AMYLASE in the last 168 hours. No results for input(s): AMMONIA in the last 168 hours. Coagulation Profile: No results for input(s): INR, PROTIME in the last 168 hours. Cardiac Enzymes: No results for input(s): CKTOTAL, CKMB, CKMBINDEX, TROPONINI in the last 168 hours. BNP (last 3 results) No  results for input(s): PROBNP in the last 8760 hours. HbA1C: No results for input(s): HGBA1C in the last 72 hours. CBG: No results for input(s): GLUCAP in the last 168 hours. Lipid Profile: No results for input(s): CHOL, HDL, LDLCALC, TRIG, CHOLHDL, LDLDIRECT in the last 72 hours. Thyroid Function Tests: No results for input(s): TSH, T4TOTAL, FREET4, T3FREE, THYROIDAB in the last 72 hours. Anemia Panel: No results for input(s): VITAMINB12, FOLATE, FERRITIN, TIBC, IRON, RETICCTPCT in the last 72 hours. Sepsis Labs: Lactic acid normal Invalid input(s): PROCALCITONIN, LACTICIDVEN Recent Results (from the past 240 hour(s))  Urine Culture     Status: Abnormal   Collection Time: 11/18/16  5:46 PM  Result Value Ref Range Status   Urine Culture, Routine Final report (A)  Final   Organism  ID, Bacteria Escherichia coli (A)  Final    Comment: Greater than 100,000 colony forming units per mL Cefazolin <=4 ug/mL Cefazolin with an MIC <=16 predicts susceptibility to the oral agents cefaclor, cefdinir, cefpodoxime, cefprozil, cefuroxime, cephalexin, and loracarbef when used for therapy of uncomplicated urinary tract infections due to E. coli, Klebsiella pneumoniae, and Proteus mirabilis.    Antimicrobial Susceptibility Comment  Final    Comment:       ** S = Susceptible; I = Intermediate; R = Resistant **                    P = Positive; N = Negative             MICS are expressed in micrograms per mL    Antibiotic                 RSLT#1    RSLT#2    RSLT#3    RSLT#4 Amoxicillin/Clavulanic Acid    S Ampicillin                     S Cefepime                       S Ceftriaxone                    S Cefuroxime                     S Ciprofloxacin                  S Ertapenem                      S Gentamicin                     S Imipenem                       S Levofloxacin                   S Meropenem                      S Nitrofurantoin                 S Piperacillin/Tazobactam         S Tetracycline                   S Tobramycin                     S Trimethoprim/Sulfa             S          Radiological Exams on Admission: Personally reviewed CT report: Ct Abdomen Pelvis W Contrast  Result Date: 11/20/2016 CLINICAL DATA:  Feeling unwell. On antibiotics for urinary tract infection. EXAM: CT ABDOMEN AND PELVIS WITH CONTRAST TECHNIQUE: Multidetector CT imaging of the abdomen and pelvis was performed using the standard protocol following bolus administration of intravenous contrast. CONTRAST:  100mL ISOVUE-300 IOPAMIDOL (ISOVUE-300) INJECTION 61% COMPARISON:  Abdominal radiograph November 18, 2016 FINDINGS: LOWER CHEST: Dependent atelectasis. Included heart size is normal. No pericardial effusion. HEPATOBILIARY: Liver and gallbladder are normal. PANCREAS: Normal. SPLEEN: Normal. ADRENALS/URINARY TRACT: Kidneys are orthotopic. Multifocal hypoenhancement RIGHT kidney, mild RIGHT perinephric fat stranding. No nephrolithiasis or hydronephrosis. Mild RIGHT urothelial enhancement.  Urinary bladder is well distended and unremarkable. STOMACH/BOWEL: The stomach, small and large bowel are normal in course and caliber without inflammatory changes, sensitivity decreased without oral contrast. Moderate amount of retained large bowel stool. The appendix is not discretely identified, however there are no inflammatory changes in the right lower quadrant. VASCULAR/LYMPHATIC: Aortoiliac vessels are normal in course and caliber. No lymphadenopathy by CT size criteria. REPRODUCTIVE: IUD in central uterus. OTHER: Small amount of free fluid in the pelvis is likely physiologic. MUSCULOSKELETAL: Nonacute. Mild sacroiliac osteoarthrosis. Severe L5-S1 degenerative disc resulting in severe LEFT L5-S1 neural foraminal narrowing. Small fat containing umbilical hernia. IMPRESSION: 1. RIGHT pyelonephritis.  No obstructive uropathy. 2. Moderate volume retained large bowel stool. No bowel obstruction. Electronically  Signed   By: Awilda Metro M.D.   On: 11/20/2016 22:31    EKG: Independently reviewed. Rate 83, QTc 482, normal sinus.        Assessment/Plan  1. Sepsis:  Suspected source UTI. Organism unknown.   Patient meets criteria given tachycardia, tachypnea, leukocytosis, and evidence of organ dysfunction.  Lactate normal but BP <90/60 mmHg.   -Sepsis bundle utilized:  -Blood and urine cultures drawn  -30 ml/kg bolus completed in ED, will repeat lactic acid  -Antibiotics: Ceftriaxone  -Repeat renal function and complete blood count in AM     2. Anxiety:  -Continue home sertraline and clonazepam and trazodone  3. Other medications:  -Continue famotidine  4. Elevated Tbili:  Minimal elevation, likely from sepsis. -Trend CMP       DVT prophylaxis: Lovenox  Code Status: FULL  Family Communication: None presnet  Disposition Plan: Anticipate IV fluids and empiric antibiotics and monitor for improvement Consults called: None Admission status: INAPTIENT           Medical decision making: Patient seen at 2:06 AM on 11/21/2016.  The patient was discussed with Dr. Particia Nearing.  What exists of the patient's chart was reviewed in depth and summarized above.  Clinical condition: hemodynamically stable, appears well.        Alberteen Sam [facility] Pager [phone]         At the time of admission, it appears that the appropriate admission status for this patient is INPATIENT. This is judged to be reasonable and necessary in order to provide the required intensity of service to ensure the patient's safety given the presenting symptoms, physical exam findings, and initial radiographic and laboratory data in the context of their chronic comorbidities.  Together, these circumstances are felt to place her at high risk for further clinical deterioration threatening life, limb, or organ.   Patient requires inpatient status due to high intensity of service,  high risk for further deterioration and high frequency of surveillance required because of this acute illness that poses a threat to life, limb or bodily function.  I certify that at the point of admission it is my clinical judgment that the patient will require inpatient hospital care spanning beyond 2 midnights from the point of admission and that early discharge would result in unnecessary risk of decompensation and readmission or threat to life, limb or bodily function.

## 2016-11-22 LAB — URINE CULTURE: Culture: NO GROWTH

## 2016-11-22 MED ORDER — ACETAMINOPHEN 325 MG PO TABS
650.0000 mg | ORAL_TABLET | Freq: Four times a day (QID) | ORAL | Status: DC | PRN
  Administered 2016-11-22 (×2): 650 mg via ORAL
  Filled 2016-11-22 (×2): qty 2

## 2016-11-22 NOTE — Progress Notes (Addendum)
[facility] PROGRESS NOTE    Progress Note  Kristy Petersen  XBJ:[id number] DOB: 1980/11/07 DOA: 11/20/2016 PCP: Magdalene River, PA-C     Brief Narrative:   Kristy Petersen is an 36 y.o. female past medical history significant for anxiety comes in with fever and chills that started out with this area several weeks ago leading to urinary frequency was treated with oral antibiotic but her symptoms recurred.  Assessment/Plan:   Sepsis (HCC) due to Acute pyelonephritis: Agree with IV Rocephin as urine culture on 11/18/2016 showed Escherichia coli pansensitive. Culture data has remained negative till date will probably have little yield. Discontinue narcotics, continue ibuprofen. CT scan showed right acute pyelonephritis no stone or abscesses.  Anxiety: Continue current home regimen.  Elevated total bilirubin: Likely due to sepsis no resolved.   DVT prophylaxis: SCD Family Communication:None Disposition Plan/Barrier to D/C: unable to determine Code Status:     Code Status Orders        Start     Ordered   11/21/16 0153  Full code  Continuous     11/21/16 0153    Code Status History    Date Active Date Inactive Code Status Order ID Comments User Context   This patient has a current code status but no historical code status.        IV Access:    Peripheral IV   Procedures and diagnostic studies:   Ct Abdomen Pelvis W Contrast  Result Date: 11/20/2016 CLINICAL DATA:  Feeling unwell. On antibiotics for urinary tract infection. EXAM: CT ABDOMEN AND PELVIS WITH CONTRAST TECHNIQUE: Multidetector CT imaging of the abdomen and pelvis was performed using the standard protocol following bolus administration of intravenous contrast. CONTRAST:  ISOVUE-300 IOPAMIDOL (ISOVUE-300) INJECTION 61% COMPARISON:  Abdominal radiograph November 18, 2016 FINDINGS: LOWER CHEST: Dependent atelectasis. Included heart size is normal. No pericardial effusion. HEPATOBILIARY: Liver  and gallbladder are normal. PANCREAS: Normal. SPLEEN: Normal. ADRENALS/URINARY TRACT: Kidneys are orthotopic. Multifocal hypoenhancement RIGHT kidney, mild RIGHT perinephric fat stranding. No nephrolithiasis or hydronephrosis. Mild RIGHT urothelial enhancement. Urinary bladder is well distended and unremarkable. STOMACH/BOWEL: The stomach, small and large bowel are normal in course and caliber without inflammatory changes, sensitivity decreased without oral contrast. Moderate amount of retained large bowel stool. The appendix is not discretely identified, however there are no inflammatory changes in the right lower quadrant. VASCULAR/LYMPHATIC: Aortoiliac vessels are normal in course and caliber. No lymphadenopathy by CT size criteria. REPRODUCTIVE: IUD in central uterus. OTHER: Small amount of free fluid in the pelvis is likely physiologic. MUSCULOSKELETAL: Nonacute. Mild sacroiliac osteoarthrosis. Severe L5-S1 degenerative disc resulting in severe LEFT L5-S1 neural foraminal narrowing. Small fat containing umbilical hernia. IMPRESSION: 1. RIGHT pyelonephritis.  No obstructive uropathy. 2. Moderate volume retained large bowel stool. No bowel obstruction. Electronically Signed   By: Awilda Metro M.D.   On: 11/20/2016 22:31     Medical Consultants:    None.  Anti-Infectives:   Rocephin  Subjective:    Kristy Petersen she relates her pain is much improved.  Objective:    Vitals:   11/21/16 1336 11/21/16 1501 11/21/16 2156 11/22/16 0545  BP: 119/71  101/68 106/62  Pulse: 97  69 62  Resp: Temp: (!) 101.8 F (38.8 C) (!) 100.8 F (38.2 C) 98.4 F (36.9 C) 97.6 F (36.4 C)  TempSrc: Oral Oral Oral Oral  SpO2: 97%  100% 99%  Weight:      Height:  Intake/Output Summary (Last 24 hours) at 11/22/16 0949 Last data filed at 11/22/16 0600  Gross per 24 hour  Intake             3270 ml  Output                0 ml  Net             3270 ml   Filed Weights   11/20/16  1944  Weight: 65.3 kg (144 lb)    Exam: General exam: In no acute distress. Respiratory system: Good air movement and clear to auscultation. Cardiovascular system: S1 & S2 heard, RRR. No JVD, murmurs, rubs, gallops or clicks.  Gastrointestinal system: Positive bowel sounds mild suprapubic tenderness. No CVA tenderness. Central nervous system: Alert and oriented. No focal neurological deficits. Extremities: No pedal edema. Skin: No rashes, lesions or ulcers Psychiatry: Judgement and insight appear normal. Mood & affect appropriate.    Data Reviewed:    Labs: Basic Metabolic Panel:  Recent Labs Lab 11/18/16 1727 11/20/16 1936 11/21/16 0611  NA 130* 137 139  K 4.3 4.0 3.5  CL 94* 105 107  CO2 GLUCOSE 84 105* 97  BUN CREATININE 0.43* 0.99 0.49  CALCIUM 8.7 8.0* 8.6*   GFR Estimated Creatinine Clearance: 101.2 mL/min (by C-G formula based on SCr of 0.49 mg/dL). Liver Function Tests:  Recent Labs Lab 11/18/16 1727 11/20/16 1936 11/21/16 0611  AST 25 34 18  ALT ALKPHOS 64 70 79  BILITOT <0.2 1.5* <0.1*  PROT 6.5 6.2* 6.9  ALBUMIN 4.1 2.9* 3.2*   No results for input(s): LIPASE, AMYLASE in the last 168 hours. No results for input(s): AMMONIA in the last 168 hours. Coagulation profile No results for input(s): INR, PROTIME in the last 168 hours.  CBC:  Recent Labs Lab 11/18/16 1735 11/20/16 1748 11/20/16 1936 11/21/16 0611  WBC 6.8 14* 14.0* 13.4*  NEUTROABS  --   --  10.4*  --   HGB 11.5* 12.0* 10.5* 11.8*  HCT 34.4* 36.1* 31.4* 35.3*  MCV 98.4* 100.2* 98.4 98.9  PLT  --   --  293 261   Cardiac Enzymes: No results for input(s): CKTOTAL, CKMB, CKMBINDEX, TROPONINI in the last 168 hours. BNP (last 3 results) No results for input(s): PROBNP in the last 8760 hours. CBG: No results for input(s): GLUCAP in the last 168 hours. D-Dimer: No results for input(s): DDIMER in the last 72 hours. Hgb A1c: No results for input(s):  HGBA1C in the last 72 hours. Lipid Profile: No results for input(s): CHOL, HDL, LDLCALC, TRIG, CHOLHDL, LDLDIRECT in the last 72 hours. Thyroid function studies: No results for input(s): TSH, T4TOTAL, T3FREE, THYROIDAB in the last 72 hours.  Invalid input(s): FREET3 Anemia work up: No results for input(s): VITAMINB12, FOLATE, FERRITIN, TIBC, IRON, RETICCTPCT in the last 72 hours. Sepsis Labs:  Recent Labs Lab 11/18/16 1735 11/20/16 1748 11/20/16 1936 11/20/16 1940 11/21/16 0611  WBC 6.8 14* 14.0*  --  13.4*  LATICACIDVEN  --   --   --  0.61  --    Microbiology Recent Results (from the past 240 hour(s))  Urine Culture     Status: Abnormal   Collection Time: 11/18/16  5:46 PM  Result Value Ref Range Status   Urine Culture, Routine Final report (A)  Final   Organism ID, Bacteria Escherichia coli (A)  Final    Comment: Greater than 100,000 colony  forming units per mL Cefazolin <=4 ug/mL Cefazolin with an MIC <=16 predicts susceptibility to the oral agents cefaclor, cefdinir, cefpodoxime, cefprozil, cefuroxime, cephalexin, and loracarbef when used for therapy of uncomplicated urinary tract infections due to E. coli, Klebsiella pneumoniae, and Proteus mirabilis.    Antimicrobial Susceptibility Comment  Final    Comment:       ** S = Susceptible; I = Intermediate; R = Resistant **                    P = Positive; N = Negative             MICS are expressed in micrograms per mL    Antibiotic                 RSLT#1    RSLT#2    RSLT#3    RSLT#4 Amoxicillin/Clavulanic Acid    S Ampicillin                     S Cefepime                       S Ceftriaxone                    S Cefuroxime                     S Ciprofloxacin                  S Ertapenem                      S Gentamicin                     S Imipenem                       S Levofloxacin                   S Meropenem                      S Nitrofurantoin                 S Piperacillin/Tazobactam         S Tetracycline                   S Tobramycin                     S Trimethoprim/Sulfa             S   Blood Culture (routine x 2)     Status: None (Preliminary result)   Collection Time: 11/20/16  7:34 PM  Result Value Ref Range Status   Specimen Description BLOOD RIGHT ANTECUBITAL  Final   Special Requests   Final    BOTTLES DRAWN AEROBIC AND ANAEROBIC Blood Culture results may not be optimal due to an inadequate volume of blood received in culture bottles   Culture   Final    NO GROWTH < 24 HOURS Performed at [facility], [address], [place], [place] [address]    Report Status PENDING  Incomplete  Blood Culture (routine x 2)     Status: None (Preliminary result)   Collection Time: 11/20/16  7:36 PM  Result Value Ref Range Status   Specimen Description BLOOD LEFT  ANTECUBITAL  Final   Special Requests   Final    BOTTLES DRAWN AEROBIC AND ANAEROBIC Blood Culture adequate volume   Culture   Final    NO GROWTH < 24 HOURS Performed at [facility], [address], [place], [place] [address]    Report Status PENDING  Incomplete  Urine culture     Status: None   Collection Time: 11/20/16  9:29 PM  Result Value Ref Range Status   Specimen Description URINE, CLEAN CATCH  Final   Special Requests NONE  Final   Culture   Final    NO GROWTH Performed at [facility], [address], [place], [place] [address]    Report Status 11/22/2016 FINAL  Final     Medications:   . famotidine  20 mg Oral QHS  . pantoprazole  40 mg Oral Daily  . polyethylene glycol  17 g Oral BID  . sertraline  100 mg Oral Daily  . traZODone  50 mg Oral QHS   Continuous Infusions: . sodium chloride 100 mL/hr at 11/22/16 0144  . cefTRIAXone (ROCEPHIN)  IV Stopped (11/21/16 2135)      LOS: 2 days   Marinda Elk  [facility] Pager [phone]  *Please refer to [email or web address], password [email or web address] to get updated schedule on who will round on this patient, as hospitalists switch  teams weekly. If 7PM-7AM, please contact night-coverage at [email or web address], password [email or web address] for any overnight needs.  11/22/2016, 9:49 AM

## 2016-11-23 MED ORDER — SULFAMETHOXAZOLE-TRIMETHOPRIM 800-160 MG PO TABS: 1.0000 | ORAL_TABLET | Freq: Two times a day (BID) | ORAL | 0 refills | Status: DC

## 2016-11-23 MED ORDER — SULFAMETHOXAZOLE-TRIMETHOPRIM 800-160 MG PO TABS
1.0000 | ORAL_TABLET | Freq: Two times a day (BID) | ORAL | Status: DC
  Administered 2016-11-23: 1 via ORAL
  Filled 2016-11-23: qty 1

## 2016-11-23 MED ORDER — SULFAMETHOXAZOLE-TRIMETHOPRIM 800-160 MG PO TABS: 1.0000 | ORAL_TABLET | Freq: Two times a day (BID) | ORAL | 0 refills | Status: AC

## 2016-11-23 NOTE — Progress Notes (Signed)
Pt to d/c home. AVS reviewed and "My Chart" discussed with pt. Pt capable of verbalizing medications. Remains hemodynamically stable. No signs and symptoms of distress. Educated pt to return to ER in the case of SOB, dizziness, or chest pain.

## 2016-11-23 NOTE — Discharge Summary (Signed)
Physician Discharge Summary  Kristy Petersen XBJ:[id number] DOB: 10-03-1980 DOA: 11/20/2016  PCP: Benjiman Core D, PA-C  Admit date: 11/20/2016 Discharge date: 11/23/2016  Admitted From: home Disposition:  Home  Recommendations for Outpatient Follow-up:  1. Follow up with PCP in 1-2 weeks 2. Please obtain BMP/CBC in one week 3. Please follow up on the following pending results:  Home Health:Home Equipment/Devices:None  Discharge Condition:stable CODE STATUS:full Diet recommendation: Heart Healthy  Brief/Interim Summary: 36 y.o. female past medical history significant for anxiety comes in with fever and chills that started out with this area several weeks ago leading to urinary frequency was treated with oral antibiotic but her symptoms recurred.  Discharge Diagnoses:  Principal Problem:   Sepsis (HCC) Active Problems:   Acute pyelonephritis   Anxiety  Sepsis due to acute pyelonephritis: She failed a seven-day course treatment, she was admitted to the hospital sclerosis status start her on IV Rocephin, urine culture was inconclusive, her last urine culture showed Escherichia coli sensitive to Bactrim. She will continue this for an additional 10 days. CT scan of the abdomen and pelvis did not show any stones or abscesses.  Anxiety: No changes were made.  Elevated total bilirubin Likely due to sepsis now resolved.  Discharge Instructions  Discharge Instructions    Diet - low sodium heart healthy    Complete by:  As directed    Increase activity slowly    Complete by:  As directed      Allergies as of 11/23/2016      Reactions   Lamotrigine Hives      Medication List    TAKE these medications   acetaminophen 325 MG tablet Commonly known as:  TYLENOL Take 650 mg by mouth every 6 (six) hours as needed for moderate pain. Reported on 04/06/2015   clonazePAM 0.5 MG tablet Commonly known as:  KLONOPIN Take 0.5 mg by mouth 2 (two) times daily as needed for anxiety.    cyclobenzaprine 10 MG tablet Commonly known as:  FLEXERIL Take 0.5 tablets (5 mg total) by mouth 3 (three) times daily as needed for muscle spasms.   Ibuprofen-Famotidine 800-26.6 MG Tabs Take 1 tablet by mouth 2 (two) times daily as needed (pain).   MIRENA (52 MG) 20 MCG/24HR IUD Generic drug:  levonorgestrel Mirena 20 mcg/24 hr (5 years) intrauterine device  Take 1 device by intrauterine route.   ranitidine 150 MG tablet Commonly known as:  ZANTAC Take 1 tablet (150 mg total) by mouth at bedtime.   sertraline 100 MG tablet Commonly known as:  ZOLOFT Take 1 tablet by mouth daily   sulfamethoxazole-trimethoprim 800-160 MG tablet Commonly known as:  BACTRIM DS,SEPTRA DS Take 1 tablet by mouth every 12 (twelve) hours.   traZODone 50 MG tablet Commonly known as:  DESYREL Take 1 tablet by mouth at bedtime            Discharge Care Instructions        Start     Ordered   11/23/16 0000  Increase activity slowly     11/23/16 1054   11/23/16 0000  Diet - low sodium heart healthy     11/23/16 1054   11/23/16 0000  sulfamethoxazole-trimethoprim (BACTRIM DS,SEPTRA DS) 800-160 MG tablet  Every 12 hours     11/23/16 1054      Allergies  Allergen Reactions  . Lamotrigine Hives    Consultations:  None   Procedures/Studies: Ct Abdomen Pelvis W Contrast  Result Date: 11/20/2016 CLINICAL DATA:  Feeling unwell.  On antibiotics for urinary tract infection. EXAM: CT ABDOMEN AND PELVIS WITH CONTRAST TECHNIQUE: Multidetector CT imaging of the abdomen and pelvis was performed using the standard protocol following bolus administration of intravenous contrast. CONTRAST:  ISOVUE-300 IOPAMIDOL (ISOVUE-300) INJECTION 61% COMPARISON:  Abdominal radiograph November 18, 2016 FINDINGS: LOWER CHEST: Dependent atelectasis. Included heart size is normal. No pericardial effusion. HEPATOBILIARY: Liver and gallbladder are normal. PANCREAS: Normal. SPLEEN: Normal. ADRENALS/URINARY TRACT:  Kidneys are orthotopic. Multifocal hypoenhancement RIGHT kidney, mild RIGHT perinephric fat stranding. No nephrolithiasis or hydronephrosis. Mild RIGHT urothelial enhancement. Urinary bladder is well distended and unremarkable. STOMACH/BOWEL: The stomach, small and large bowel are normal in course and caliber without inflammatory changes, sensitivity decreased without oral contrast. Moderate amount of retained large bowel stool. The appendix is not discretely identified, however there are no inflammatory changes in the right lower quadrant. VASCULAR/LYMPHATIC: Aortoiliac vessels are normal in course and caliber. No lymphadenopathy by CT size criteria. REPRODUCTIVE: IUD in central uterus. OTHER: Small amount of free fluid in the pelvis is likely physiologic. MUSCULOSKELETAL: Nonacute. Mild sacroiliac osteoarthrosis. Severe L5-S1 degenerative disc resulting in severe LEFT L5-S1 neural foraminal narrowing. Small fat containing umbilical hernia. IMPRESSION: 1. RIGHT pyelonephritis.  No obstructive uropathy. 2. Moderate volume retained large bowel stool. No bowel obstruction. Electronically Signed   By: Awilda Metro M.D.   On: 11/20/2016 22:31   Dg Abd 2 Views  Result Date: 11/18/2016 CLINICAL DATA:  Epigastric pain EXAM: ABDOMEN - 2 VIEW COMPARISON:  None. FINDINGS: Moderate stool burden in the colon. IUD noted in the pelvis. No evidence of bowel obstruction, free air organomegaly. No suspicious calcification. Visualized lung bases clear. IMPRESSION: Moderate stool burden.  No acute findings. Electronically Signed   By: Charlett Nose M.D.   On: 11/18/2016 17:30     Subjective: No complaints feels great wants to home.  Discharge Exam: Vitals:   11/22/16 2214 11/23/16 0536  BP: 130/83 119/73  Pulse:  63  Resp:  16  Temp:  98.6 F (37 C)  SpO2:  98%   Vitals:   11/22/16 1317 11/22/16 2213 11/22/16 2214 11/23/16 0536  BP: 107/79 (!) 120/92 130/83 119/73  Pulse: 74 68  63  Resp: Temp:  98.5 F (36.9 C) 98.3 F (36.8 C)  98.6 F (37 C)  TempSrc: Oral Oral  Oral  SpO2: 100% 100%  98%  Weight:      Height:        General: Pt is alert, awake, not in acute distress Cardiovascular: RRR, S1/S2 +, no rubs, no gallops Respiratory: CTA bilaterally, no wheezing, no rhonchi Abdominal: Soft, NT, ND, bowel sounds + Extremities: no edema, no cyanosis    The results of significant diagnostics from this hospitalization (including imaging, microbiology, ancillary and laboratory) are listed below for reference.     Microbiology: Recent Results (from the past 240 hour(s))  Urine Culture     Status: Abnormal   Collection Time: 11/18/16  5:46 PM  Result Value Ref Range Status   Urine Culture, Routine Final report (A)  Final   Organism ID, Bacteria Escherichia coli (A)  Final    Comment: Greater than 100,000 colony forming units per mL Cefazolin <=4 ug/mL Cefazolin with an MIC <=16 predicts susceptibility to the oral agents cefaclor, cefdinir, cefpodoxime, cefprozil, cefuroxime, cephalexin, and loracarbef when used for therapy of uncomplicated urinary tract infections due to E. coli, Klebsiella pneumoniae, and Proteus mirabilis.    Antimicrobial Susceptibility Comment  Final  Comment:       ** S = Susceptible; I = Intermediate; R = Resistant **                    P = Positive; N = Negative             MICS are expressed in micrograms per mL    Antibiotic                 RSLT#1    RSLT#2    RSLT#3    RSLT#4 Amoxicillin/Clavulanic Acid    S Ampicillin                     S Cefepime                       S Ceftriaxone                    S Cefuroxime                     S Ciprofloxacin                  S Ertapenem                      S Gentamicin                     S Imipenem                       S Levofloxacin                   S Meropenem                      S Nitrofurantoin                 S Piperacillin/Tazobactam        S Tetracycline                    S Tobramycin                     S Trimethoprim/Sulfa             S   Blood Culture (routine x 2)     Status: None (Preliminary result)   Collection Time: 11/20/16  7:34 PM  Result Value Ref Range Status   Specimen Description BLOOD RIGHT ANTECUBITAL  Final   Special Requests   Final    BOTTLES DRAWN AEROBIC AND ANAEROBIC Blood Culture results may not be optimal due to an inadequate volume of blood received in culture bottles   Culture   Final    NO GROWTH 3 DAYS Performed at [facility], [address], [place], [place] [address]    Report Status PENDING  Incomplete  Blood Culture (routine x 2)     Status: None (Preliminary result)   Collection Time: 11/20/16  7:36 PM  Result Value Ref Range Status   Specimen Description BLOOD LEFT ANTECUBITAL  Final   Special Requests   Final    BOTTLES DRAWN AEROBIC AND ANAEROBIC Blood Culture adequate volume   Culture   Final    NO GROWTH 3 DAYS Performed at [facility], [address], [place], [place] [address]    Report Status PENDING  Incomplete  Urine culture     Status: None   Collection Time: 11/20/16  9:29 PM  Result Value Ref Range Status   Specimen Description URINE, CLEAN CATCH  Final   Special Requests NONE  Final   Culture   Final    NO GROWTH Performed at [facility], [address] [place], [place] [address]    Report Status 11/22/2016 FINAL  Final     Labs: BNP (last 3 results) No results for input(s): BNP in the last 8760 hours. Basic Metabolic Panel:  Recent Labs Lab 11/18/16 1727 11/20/16 1936 11/21/16 0611  NA 130* 137 139  K 4.3 4.0 3.5  CL 94* 105 107  CO2 22 26 23   GLUCOSE 84 105* 97  BUN 8 9 6   CREATININE 0.43* 0.99 0.49  CALCIUM 8.7 8.0* 8.6*   Liver Function Tests:  Recent Labs Lab 11/18/16 1727 11/20/16 1936 11/21/16 0611  AST 25 34 18  ALT 16 15 16   ALKPHOS 64 70 79  BILITOT <0.2 1.5* <0.1*  PROT 6.5 6.2* 6.9  ALBUMIN 4.1 2.9* 3.2*   No results for input(s):  LIPASE, AMYLASE in the last 168 hours. No results for input(s): AMMONIA in the last 168 hours. CBC:  Recent Labs Lab 11/18/16 1735 11/20/16 1748 11/20/16 1936 11/21/16 0611  WBC 6.8 14* 14.0* 13.4*  NEUTROABS  --   --  10.4*  --   HGB 11.5* 12.0* 10.5* 11.8*  HCT 34.4* 36.1* 31.4* 35.3*  MCV 98.4* 100.2* 98.4 98.9  PLT  --   --  293 261   Cardiac Enzymes: No results for input(s): CKTOTAL, CKMB, CKMBINDEX, TROPONINI in the last 168 hours. BNP: Invalid input(s): POCBNP CBG: No results for input(s): GLUCAP in the last 168 hours. D-Dimer No results for input(s): DDIMER in the last 72 hours. Hgb A1c No results for input(s): HGBA1C in the last 72 hours. Lipid Profile No results for input(s): CHOL, HDL, LDLCALC, TRIG, CHOLHDL, LDLDIRECT in the last 72 hours. Thyroid function studies No results for input(s): TSH, T4TOTAL, T3FREE, THYROIDAB in the last 72 hours.  Invalid input(s): FREET3 Anemia work up No results for input(s): VITAMINB12, FOLATE, FERRITIN, TIBC, IRON, RETICCTPCT in the last 72 hours. Urinalysis    Component Value Date/Time   COLORURINE YELLOW 11/20/2016 2129   APPEARANCEUR CLEAR 11/20/2016 2129   LABSPEC 1.008 11/20/2016 2129   PHURINE 6.0 11/20/2016 2129   GLUCOSEU NEGATIVE 11/20/2016 2129   HGBUR SMALL (A) 11/20/2016 2129   BILIRUBINUR NEGATIVE 11/20/2016 2129   BILIRUBINUR negative 11/18/2016 1738   KETONESUR NEGATIVE 11/20/2016 2129   PROTEINUR 100 (A) 11/20/2016 2129   UROBILINOGEN 0.2 11/18/2016 1738   NITRITE NEGATIVE 11/20/2016 2129   LEUKOCYTESUR NEGATIVE 11/20/2016 2129   Sepsis Labs Invalid input(s): PROCALCITONIN,  WBC,  LACTICIDVEN Microbiology Recent Results (from the past 240 hour(s))  Urine Culture     Status: Abnormal   Collection Time: 11/18/16  5:46 PM  Result Value Ref Range Status   Urine Culture, Routine Final report (A)  Final   Organism ID, Bacteria Escherichia coli (A)  Final    Comment: Greater than 100,000 colony forming  units per mL Cefazolin <=4 ug/mL Cefazolin with an MIC <=16 predicts susceptibility to the oral agents cefaclor, cefdinir, cefpodoxime, cefprozil, cefuroxime, cephalexin, and loracarbef when used for therapy of uncomplicated urinary tract infections due to E. coli, Klebsiella pneumoniae, and Proteus mirabilis.    Antimicrobial Susceptibility Comment  Final    Comment:       **  S = Susceptible; I = Intermediate; R = Resistant **                    P = Positive; N = Negative             MICS are expressed in micrograms per mL    Antibiotic                 RSLT#1    RSLT#2    RSLT#3    RSLT#4 Amoxicillin/Clavulanic Acid    S Ampicillin                     S Cefepime                       S Ceftriaxone                    S Cefuroxime                     S Ciprofloxacin                  S Ertapenem                      S Gentamicin                     S Imipenem                       S Levofloxacin                   S Meropenem                      S Nitrofurantoin                 S Piperacillin/Tazobactam        S Tetracycline                   S Tobramycin                     S Trimethoprim/Sulfa             S   Blood Culture (routine x 2)     Status: None (Preliminary result)   Collection Time: 11/20/16  7:34 PM  Result Value Ref Range Status   Specimen Description BLOOD RIGHT ANTECUBITAL  Final   Special Requests   Final    BOTTLES DRAWN AEROBIC AND ANAEROBIC Blood Culture results may not be optimal due to an inadequate volume of blood received in culture bottles   Culture   Final    NO GROWTH 3 DAYS Performed at [facility], [address] [place], [place] [address]    Report Status PENDING  Incomplete  Blood Culture (routine x 2)     Status: None (Preliminary result)   Collection Time: 11/20/16  7:36 PM  Result Value Ref Range Status   Specimen Description BLOOD LEFT ANTECUBITAL  Final   Special Requests   Final    BOTTLES DRAWN AEROBIC AND ANAEROBIC Blood  Culture adequate volume   Culture   Final    NO GROWTH 3 DAYS Performed at [facility], [address] [place], [place] [address]    Report Status PENDING  Incomplete  Urine culture     Status:  None   Collection Time: 11/20/16  9:29 PM  Result Value Ref Range Status   Specimen Description URINE, CLEAN CATCH  Final   Special Requests NONE  Final   Culture   Final    NO GROWTH Performed at [facility], [address], [place], [place] [address]    Report Status 11/22/2016 FINAL  Final     Time coordinating discharge: Over 30 minutes  SIGNED:   Marinda Elk, MD  [facility] 11/23/2016, 10:54 AM Pager   If 7PM-7AM, please contact night-coverage [email or web address] Password [email or web address]

## 2016-11-25 LAB — CULTURE, BLOOD (ROUTINE X 2)
CULTURE: NO GROWTH
CULTURE: NO GROWTH
Special Requests: ADEQUATE

## 2016-12-14 ENCOUNTER — Other Ambulatory Visit: Payer: Self-pay | Admitting: Physician Assistant

## 2016-12-18 DIAGNOSIS — F411 Generalized anxiety disorder: Secondary | ICD-10-CM | POA: Diagnosis not present

## 2016-12-18 DIAGNOSIS — F4010 Social phobia, unspecified: Secondary | ICD-10-CM | POA: Diagnosis not present

## 2016-12-18 DIAGNOSIS — F3181 Bipolar II disorder: Secondary | ICD-10-CM | POA: Diagnosis not present

## 2016-12-18 DIAGNOSIS — F4310 Post-traumatic stress disorder, unspecified: Secondary | ICD-10-CM | POA: Diagnosis not present

## 2017-02-26 DIAGNOSIS — F4010 Social phobia, unspecified: Secondary | ICD-10-CM | POA: Diagnosis not present

## 2017-02-26 DIAGNOSIS — F411 Generalized anxiety disorder: Secondary | ICD-10-CM | POA: Diagnosis not present

## 2017-02-26 DIAGNOSIS — F4310 Post-traumatic stress disorder, unspecified: Secondary | ICD-10-CM | POA: Diagnosis not present

## 2017-02-26 DIAGNOSIS — F3181 Bipolar II disorder: Secondary | ICD-10-CM | POA: Diagnosis not present

## 2017-05-08 ENCOUNTER — Encounter: Payer: Self-pay | Admitting: Family Medicine

## 2017-05-08 ENCOUNTER — Ambulatory Visit: Payer: BLUE CROSS/BLUE SHIELD | Admitting: Family Medicine

## 2017-05-08 ENCOUNTER — Other Ambulatory Visit: Payer: Self-pay

## 2017-05-08 VITALS — BP 108/64 | HR 84 | Temp 98.4°F | Ht 62.99 in | Wt 162.5 lb

## 2017-05-08 DIAGNOSIS — M545 Low back pain: Secondary | ICD-10-CM

## 2017-05-08 DIAGNOSIS — G8929 Other chronic pain: Secondary | ICD-10-CM

## 2017-05-08 MED ORDER — CYCLOBENZAPRINE HCL 10 MG PO TABS: 10.0000 mg | ORAL_TABLET | Freq: Three times a day (TID) | ORAL | 1 refills | Status: AC | PRN

## 2017-05-08 NOTE — Progress Notes (Signed)
2/28/20195:25 PM  Kristy Petersen [birth year], 37 y.o. female [id number]  Chief Complaint  Patient presents with  . Back Pain    does not have a disc in the last 2 of the vertebrae. Needing pain meds until nx Ortho appt    HPI:   Patient is a 37 y.o. female with past medical history significant for low back pain who presents today requesting refill of flexeril.  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 She was scheduled for "injections" but cancelled as she was afraid. Her back pain which she describes as tight across her low back pain which sometimes goes down into her thighs has been getting worse. She takes ibuprofen with famotidine daily. She has requested an appt with ortho. She is requesting refill of flexeril as it has helped in the past. She denies any changes to bladder or bowel function. She denies any saddle anesthesia or lower extremity weakness. She denies any changes in gait.   Depression screen [facility] 2/9 05/08/2017 11/20/2016 11/18/2016  Decreased Interest 0 0 0  Down, Depressed, Hopeless 0 0 0  PHQ - 2 Score 0 0 0  Altered sleeping - 0 -  Tired, decreased energy - 0 -  Change in appetite - 0 -  Feeling bad or failure about yourself  - 0 -  Trouble concentrating - - -  Moving slowly or fidgety/restless - 0 -  Suicidal thoughts - 0 -  PHQ-9 Score - 0 -  Difficult doing work/chores - Not difficult at all -    Allergies  Allergen Reactions  . Lamotrigine Hives    Prior to Admission medications   Medication Sig Start Date End Date Taking? Authorizing Provider  clonazePAM (KLONOPIN) 0.5 MG tablet Take 0.5 mg by mouth 2 (two) times daily as needed for anxiety.    Yes [provider]  cyclobenzaprine (FLEXERIL) 10 MG tablet Take 0.5 tablets (5 mg total) by mouth 3 (three) times daily as needed for  muscle spasms. 09/24/16  Yes Benjiman Core D, PA-C  levonorgestrel (MIRENA, 52 MG,) 20 MCG/24HR IUD Mirena 20 mcg/24 hr (5 years) intrauterine device  Take 1 device by intrauterine route.   Yes [provider]  sertraline (ZOLOFT) 100 MG tablet Take 1 tablet by mouth daily   Yes [provider]  traZODone (DESYREL) 50 MG tablet Take 1 tablet by mouth at bedtime   Yes [provider]  acetaminophen (TYLENOL) 325 MG tablet Take 650 mg by mouth every 6 (six) hours as needed for moderate pain. Reported on 04/06/2015    [provider]  Ibuprofen-Famotidine 800-26.6 MG TABS Take 1 tablet by mouth 2 (two) times daily as needed (pain).    [provider]  ranitidine (ZANTAC) 150 MG tablet Take 1 tablet (150 mg total) by mouth at bedtime. Ov needed Patient not taking: Reported on 05/08/2017 12/16/16   Ofilia Neas, PA-C  sulfamethoxazole-trimethoprim (BACTRIM DS,SEPTRA DS) 800-160 MG tablet Take 1 tablet by mouth every 12 (twelve) hours. Patient not taking: Reported on 05/08/2017 11/23/16   Marinda Elk, MD    Past Medical History:  Diagnosis Date  . Allergy   . Anxiety     History reviewed. No pertinent surgical history.  Social History   Tobacco Use  . Smoking status: Former Smoker    Last  attempt to quit: 08/14/2011    Years since quitting: 5.7  . Smokeless tobacco: Never Used  Substance Use Topics  . Alcohol use: Yes    Alcohol/week: 4.2 oz    Types: 7 Cans of beer per week    Family History  Problem Relation Age of Onset  . Lupus Mother   . Crohn's disease Father     ROS Per hpi  OBJECTIVE:  Blood pressure 108/64, pulse 84, temperature 98.4 F (36.9 C), temperature source Oral, height 5' 2.99" (1.6 m), weight 162 lb 8 oz (73.7 kg), SpO2 98 %.  Physical Exam  Constitutional: She is well-developed, well-nourished, and in no distress.  Musculoskeletal:       Lumbar back: She exhibits tenderness, bony tenderness and  spasm.  Neurological: She has normal strength and normal reflexes. She has a normal Straight Leg Raise Test. Gait normal.     ASSESSMENT and PLAN  1. Chronic bilateral low back pain without sciatica Medication refilled, r/se/b reviewed. Fu with ortho as scheduled. - cyclobenzaprine (FLEXERIL) 10 MG tablet; Take 1 tablet (10 mg total) by mouth 3 (three) times daily as needed for muscle spasms.  Return if symptoms worsen or fail to improve.    Myles LippsIrma M Santiago, MD Primary Care at [facility] [address] [place], [place] [id number] Ph.  [phone] Fax [phone]

## 2017-05-08 NOTE — Patient Instructions (Signed)
     IF you received an x-ray today, you will receive an invoice from [facility]. Please contact [facility] at [phone] with questions or concerns regarding your invoice.   IF you received labwork today, you will receive an invoice from [facility]. Please contact [facility] at [phone] with questions or concerns regarding your invoice.   Our billing staff will not be able to assist you with questions regarding bills from these companies.  You will be contacted with the lab results as soon as they are available. The fastest way to get your results is to activate your My Chart account. Instructions are located on the last page of this paperwork. If you have not heard from us regarding the results in 2 weeks, please contact this office.     

## 2017-05-19 DIAGNOSIS — F4310 Post-traumatic stress disorder, unspecified: Secondary | ICD-10-CM | POA: Diagnosis not present

## 2017-05-19 DIAGNOSIS — F411 Generalized anxiety disorder: Secondary | ICD-10-CM | POA: Diagnosis not present

## 2017-05-19 DIAGNOSIS — F3181 Bipolar II disorder: Secondary | ICD-10-CM | POA: Diagnosis not present

## 2017-06-14 DIAGNOSIS — M5416 Radiculopathy, lumbar region: Secondary | ICD-10-CM | POA: Diagnosis not present

## 2017-06-24 DIAGNOSIS — M5416 Radiculopathy, lumbar region: Secondary | ICD-10-CM | POA: Diagnosis not present

## 2017-07-10 DIAGNOSIS — M5416 Radiculopathy, lumbar region: Secondary | ICD-10-CM | POA: Diagnosis not present

## 2017-07-24 DIAGNOSIS — M5416 Radiculopathy, lumbar region: Secondary | ICD-10-CM | POA: Diagnosis not present

## 2017-10-21 DIAGNOSIS — M545 Low back pain: Secondary | ICD-10-CM | POA: Diagnosis not present

## 2017-11-19 DIAGNOSIS — Z6823 Body mass index (BMI) 23.0-23.9, adult: Secondary | ICD-10-CM | POA: Diagnosis not present

## 2017-11-19 DIAGNOSIS — Z01419 Encounter for gynecological examination (general) (routine) without abnormal findings: Secondary | ICD-10-CM | POA: Diagnosis not present

## 2018-01-12 DIAGNOSIS — F4310 Post-traumatic stress disorder, unspecified: Secondary | ICD-10-CM | POA: Diagnosis not present

## 2018-01-12 DIAGNOSIS — F411 Generalized anxiety disorder: Secondary | ICD-10-CM | POA: Diagnosis not present

## 2018-01-12 DIAGNOSIS — F3181 Bipolar II disorder: Secondary | ICD-10-CM | POA: Diagnosis not present

## 2018-02-09 DIAGNOSIS — F411 Generalized anxiety disorder: Secondary | ICD-10-CM | POA: Diagnosis not present

## 2018-02-09 DIAGNOSIS — F3181 Bipolar II disorder: Secondary | ICD-10-CM | POA: Diagnosis not present

## 2018-02-09 DIAGNOSIS — F4310 Post-traumatic stress disorder, unspecified: Secondary | ICD-10-CM | POA: Diagnosis not present

## 2018-08-31 DIAGNOSIS — F3181 Bipolar II disorder: Secondary | ICD-10-CM | POA: Diagnosis not present

## 2018-08-31 DIAGNOSIS — F4310 Post-traumatic stress disorder, unspecified: Secondary | ICD-10-CM | POA: Diagnosis not present

## 2018-08-31 DIAGNOSIS — F411 Generalized anxiety disorder: Secondary | ICD-10-CM | POA: Diagnosis not present

## 2018-09-29 DIAGNOSIS — F3181 Bipolar II disorder: Secondary | ICD-10-CM | POA: Diagnosis not present

## 2018-09-29 DIAGNOSIS — F4310 Post-traumatic stress disorder, unspecified: Secondary | ICD-10-CM | POA: Diagnosis not present

## 2018-09-29 DIAGNOSIS — F411 Generalized anxiety disorder: Secondary | ICD-10-CM | POA: Diagnosis not present

## 2018-11-24 DIAGNOSIS — F3181 Bipolar II disorder: Secondary | ICD-10-CM | POA: Diagnosis not present

## 2018-11-24 DIAGNOSIS — F4310 Post-traumatic stress disorder, unspecified: Secondary | ICD-10-CM | POA: Diagnosis not present

## 2018-11-24 DIAGNOSIS — F411 Generalized anxiety disorder: Secondary | ICD-10-CM | POA: Diagnosis not present

## 2018-12-22 DIAGNOSIS — F3181 Bipolar II disorder: Secondary | ICD-10-CM | POA: Diagnosis not present

## 2018-12-22 DIAGNOSIS — F4310 Post-traumatic stress disorder, unspecified: Secondary | ICD-10-CM | POA: Diagnosis not present

## 2018-12-22 DIAGNOSIS — F411 Generalized anxiety disorder: Secondary | ICD-10-CM | POA: Diagnosis not present

## 2019-04-12 IMAGING — CT CT ABD-PELV W/ CM
2 of 4 series · 16 of 46 positions shown, 18 images · IV contrast (iopamidol)
Comparison: Abdominal radiograph November 18, 2016

CLINICAL DATA: Feeling unwell. On antibiotics for urinary tract
infection.

EXAM:
CT ABDOMEN AND PELVIS WITH CONTRAST
TECHNIQUE: Multidetector CT imaging of the abdomen and pelvis was performed
using the standard protocol following bolus administration of
intravenous contrast.
CONTRAST:  100mL TVHE6P-QHH IOPAMIDOL (TVHE6P-QHH) INJECTION 61%

[Series 2: abd/pel with · axial · 0.69mm/px · z∈[+897,+1302]mm · 13 of 91 slices shown, 15 images]
[im 5/91  soft-tissue]
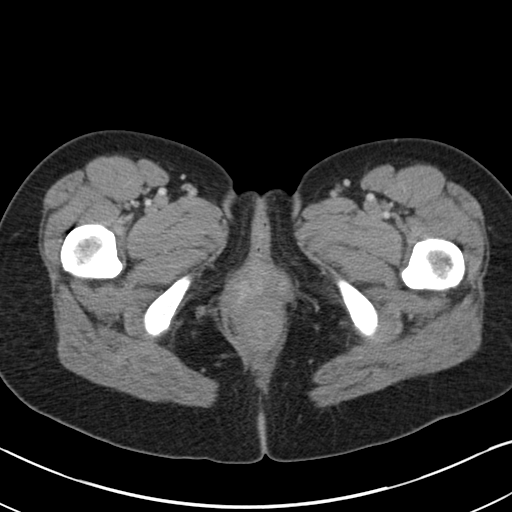
[im 5/91  bone]
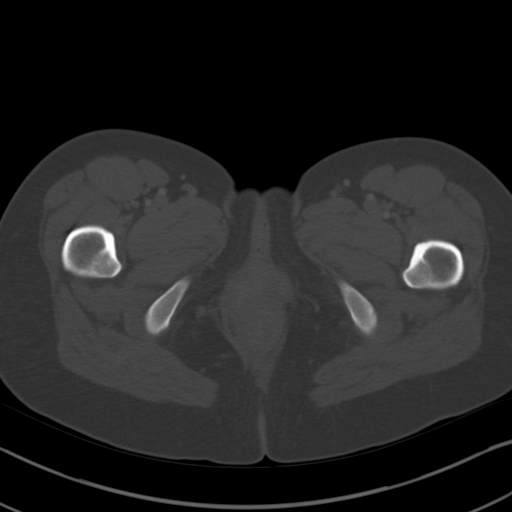
[im 15/91  soft-tissue]
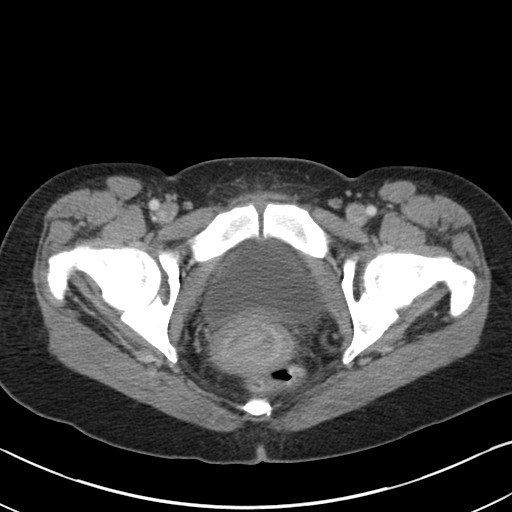
[im 19/91  soft-tissue]
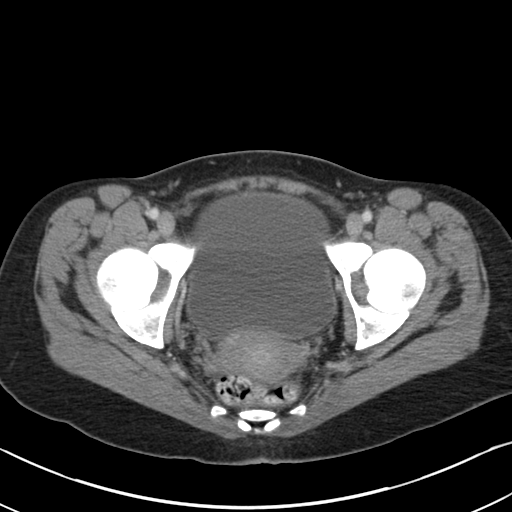
[im 24/91  soft-tissue]
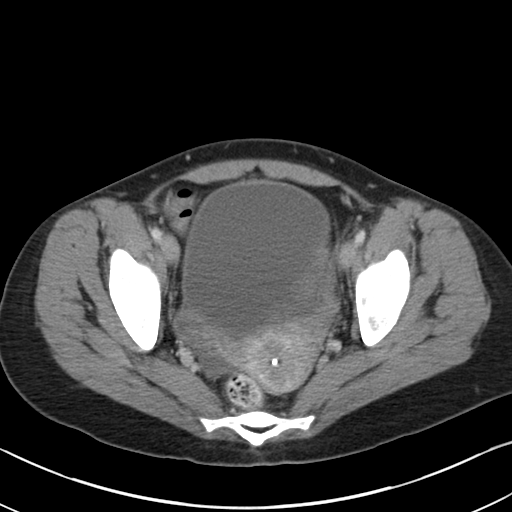
[im 34/91  soft-tissue]
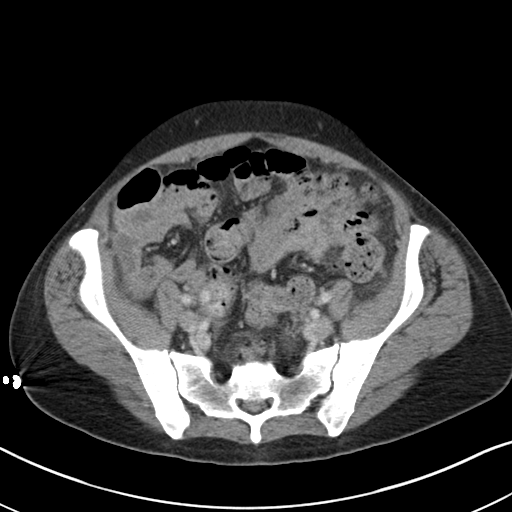
[im 38/91  soft-tissue]
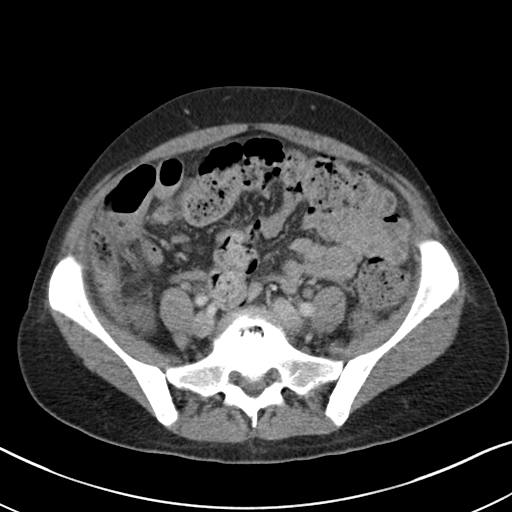
[im 48/91  soft-tissue]
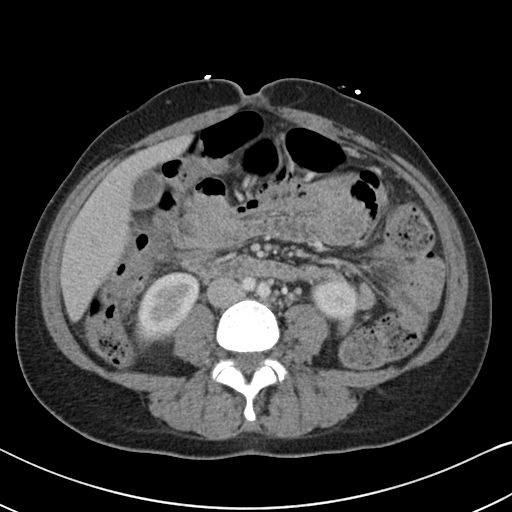
[im 53/91  soft-tissue]
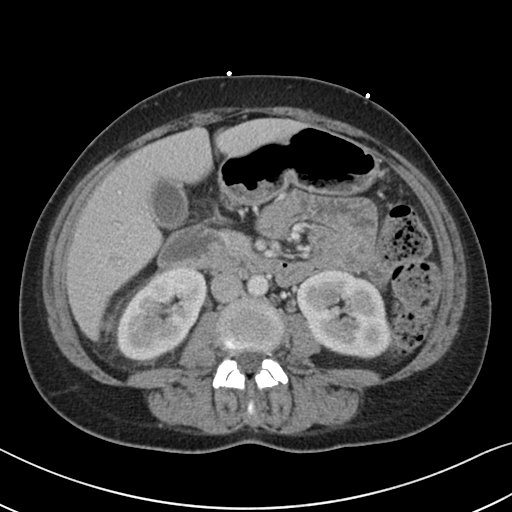
[im 57/91  soft-tissue]
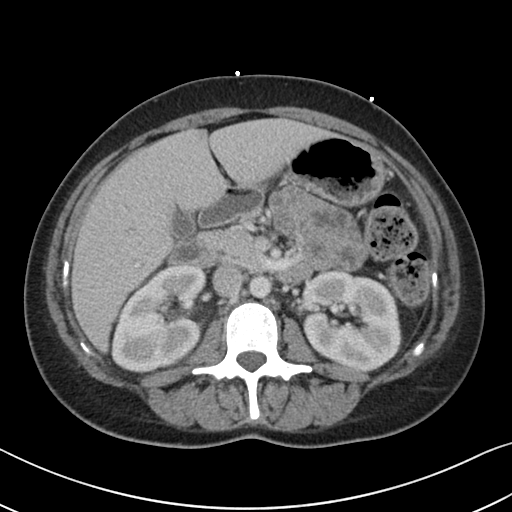
[im 57/91  bone]
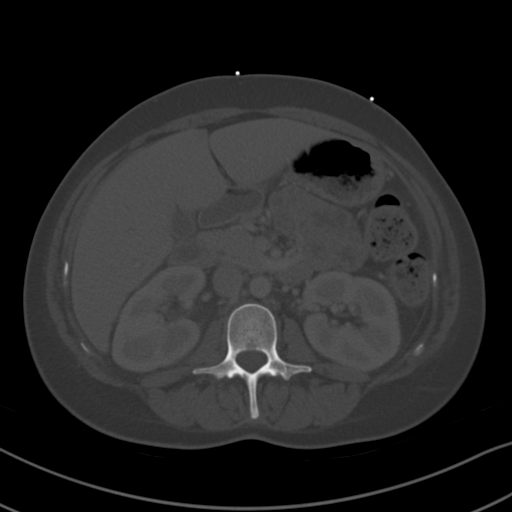
[im 67/91  soft-tissue]
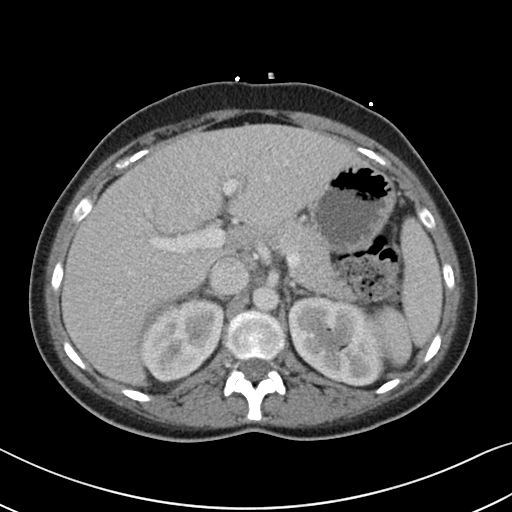
[im 72/91  soft-tissue]
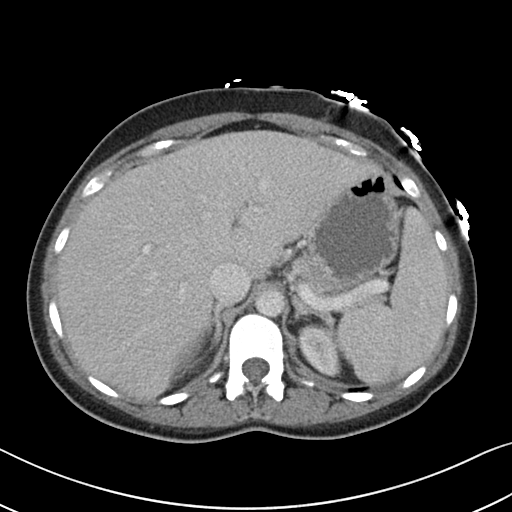
[im 76/91  soft-tissue]
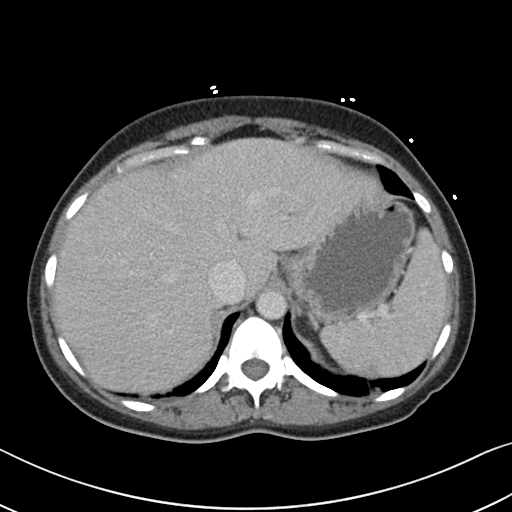
[im 86/91  soft-tissue]
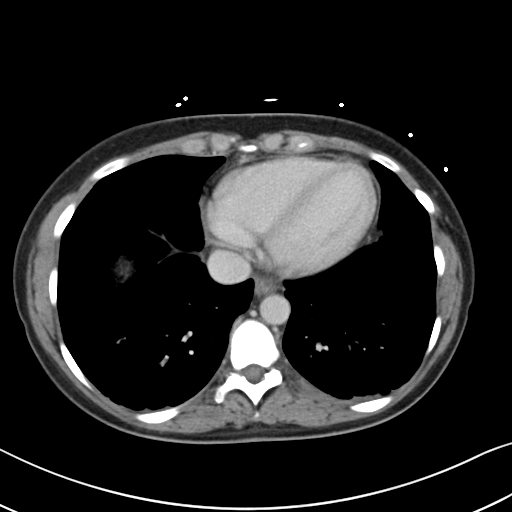

[Series 5: coronal a/|p · coronal · 0.64mm/px · 3 of 133 slices shown]
[im 45/133  soft-tissue]
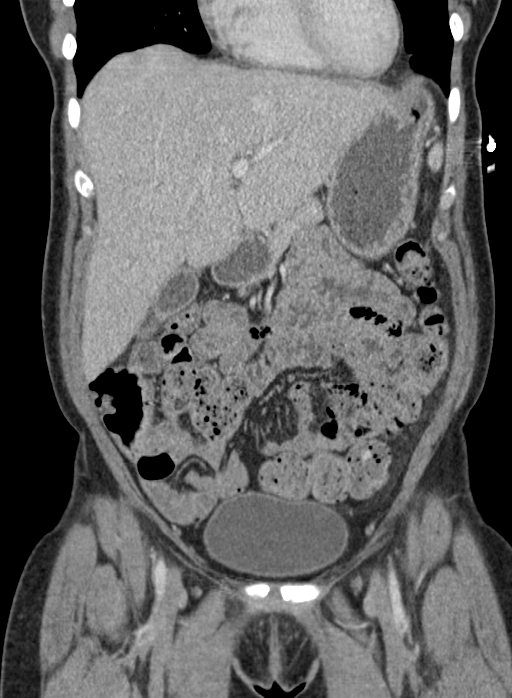
[im 59/133  soft-tissue]
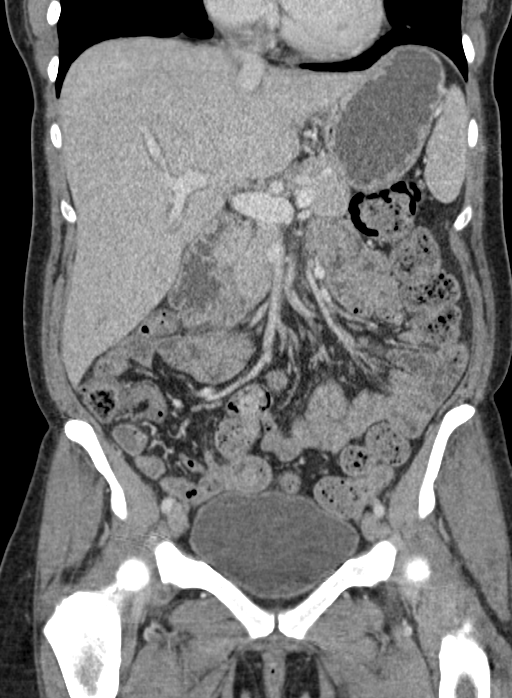
[im 74/133  soft-tissue]
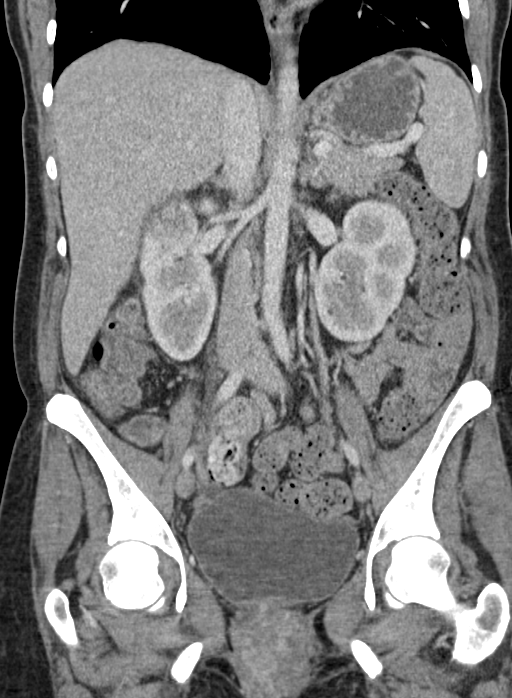

[16 of 46 positions shown; findings below may reference images not displayed]

FINDINGS: LOWER CHEST: Dependent atelectasis. Included heart size is normal.
No pericardial effusion.

HEPATOBILIARY: Liver and gallbladder are normal.

PANCREAS: Normal.

SPLEEN: Normal.

ADRENALS/URINARY TRACT: Kidneys are orthotopic. Multifocal
hypoenhancement RIGHT kidney, mild RIGHT perinephric fat stranding.
No nephrolithiasis or hydronephrosis. Mild RIGHT urothelial
enhancement. Urinary bladder is well distended and unremarkable.

STOMACH/BOWEL: The stomach, small and large bowel are normal in
course and caliber without inflammatory changes, sensitivity
decreased without oral contrast. Moderate amount of retained large
bowel stool. The appendix is not discretely identified, however
there are no inflammatory changes in the right lower quadrant.

VASCULAR/LYMPHATIC: Aortoiliac vessels are normal in course and
caliber. No lymphadenopathy by CT size criteria.

REPRODUCTIVE: IUD in central uterus.

OTHER: Small amount of free fluid in the pelvis is likely
physiologic.

MUSCULOSKELETAL: Nonacute. Mild sacroiliac osteoarthrosis. Severe
L5-S1 degenerative disc resulting in severe LEFT L5-S1 neural
foraminal narrowing. Small fat containing umbilical hernia.
IMPRESSION: 1. RIGHT pyelonephritis.  No obstructive uropathy.
2. Moderate volume retained large bowel stool. No bowel obstruction.

## 2020-10-27 ENCOUNTER — Other Ambulatory Visit: Payer: Self-pay | Admitting: Obstetrics and Gynecology

## 2020-10-27 DIAGNOSIS — N6310 Unspecified lump in the right breast, unspecified quadrant: Secondary | ICD-10-CM

## 2020-12-26 ENCOUNTER — Other Ambulatory Visit: Payer: BLUE CROSS/BLUE SHIELD

## 2020-12-26 ENCOUNTER — Inpatient Hospital Stay: Admission: RE | Admit: 2020-12-26 | Payer: BLUE CROSS/BLUE SHIELD | Source: Ambulatory Visit

## 2023-06-21 DIAGNOSIS — Z419 Encounter for procedure for purposes other than remedying health state, unspecified: Secondary | ICD-10-CM | POA: Diagnosis not present

## 2023-07-21 DIAGNOSIS — Z419 Encounter for procedure for purposes other than remedying health state, unspecified: Secondary | ICD-10-CM | POA: Diagnosis not present

## 2023-08-21 DIAGNOSIS — Z419 Encounter for procedure for purposes other than remedying health state, unspecified: Secondary | ICD-10-CM | POA: Diagnosis not present

## 2023-09-20 DIAGNOSIS — Z419 Encounter for procedure for purposes other than remedying health state, unspecified: Secondary | ICD-10-CM | POA: Diagnosis not present

## 2023-10-13 ENCOUNTER — Emergency Department (HOSPITAL_BASED_OUTPATIENT_CLINIC_OR_DEPARTMENT_OTHER)
Admission: EM | Admit: 2023-10-13 | Discharge: 2023-10-14 | Disposition: A | Discharge status: Home / Self Care | Attending: Emergency Medicine | Admitting: Emergency Medicine

## 2023-10-13 ENCOUNTER — Encounter (HOSPITAL_BASED_OUTPATIENT_CLINIC_OR_DEPARTMENT_OTHER): Payer: Self-pay

## 2023-10-13 ENCOUNTER — Other Ambulatory Visit: Payer: Self-pay

## 2023-10-13 DIAGNOSIS — Z760 Encounter for issue of repeat prescription: Secondary | ICD-10-CM | POA: Insufficient documentation

## 2023-10-13 HISTORY — DX: Bipolar II disorder: F31.81

## 2023-10-13 HISTORY — DX: Social phobia, unspecified: F40.10

## 2023-10-13 HISTORY — DX: Depression, unspecified: F32.A

## 2023-10-13 HISTORY — DX: Post-traumatic stress disorder, unspecified: F43.10

## 2023-10-13 HISTORY — DX: Attention-deficit hyperactivity disorder, unspecified type: F90.9

## 2023-10-13 NOTE — ED Triage Notes (Signed)
 Pt presents with a request for med refill on her mental health medications. Pt states she had a change of insurance which has caused her to have to change PCPs. Pt reports that she needs refills on Abilify and propranolol. She has not had her medication x 1 week. Pt feels increased anxiety and depression and passive suicidal ideations with no intent or plan.

## 2023-10-14 NOTE — ED Provider Notes (Signed)
  EMERGENCY DEPARTMENT AT [facility] Provider Note  CSN: [id number] Arrival date & time: 10/13/23 2244  Chief Complaint(s) Medication Refill  HPI Kristy Petersen is a 43 y.o. female with a past medical history listed below here for assistance with medication refill.  Reports that she has refill prescription for her Abilify but unable to afford the medication.  States that she has been trying to get into see new provider that her Medicare will take that would be able to prescribe her medication.  States that she does have propranolol.  Denies active SI, HI or AVH.   Medication Refill   Past Medical History Past Medical History:  Diagnosis Date   ADHD    Allergy    Anxiety    Bipolar 2 disorder (HCC)    Depression    PTSD (post-traumatic stress disorder)    Social phobia    Patient Active Problem List   Diagnosis Date Noted   Acute pyelonephritis 11/21/2016   Anxiety 11/21/2016   Sepsis (HCC) 11/20/2016   Chronic low back pain 05/23/2015   Home Medication(s) Prior to Admission medications   Medication Sig Start Date End Date Taking? Authorizing Provider  acetaminophen  (TYLENOL ) 325 MG tablet Take 650 mg by mouth every 6 (six) hours as needed for moderate pain. Reported on 04/06/2015    [provider]  clonazePAM  (KLONOPIN ) 0.5 MG tablet Take 0.5 mg by mouth 2 (two) times daily as needed for anxiety.     [provider]  cyclobenzaprine  (FLEXERIL ) 10 MG tablet Take 1 tablet (10 mg total) by mouth 3 (three) times daily as needed for muscle spasms. 05/08/17   Melonie Tori Mikel CHRISTELLA, MD  Ibuprofen -Famotidine  800-26.6 MG TABS Take 1 tablet by mouth 2 (two) times daily as needed (pain).    [provider]  levonorgestrel (MIRENA, 52 MG,) 20 MCG/24HR IUD Mirena 20 mcg/24 hr (5 years) intrauterine device  Take 1 device by intrauterine route.    [provider]  ranitidine  (ZANTAC ) 150 MG tablet Take 1 tablet (150 mg total) by mouth at  bedtime. Ov needed Patient not taking: Reported on 05/08/2017 12/16/16   Gretta Ozell CROME, PA-C  sertraline  (ZOLOFT ) 100 MG tablet Take 1 tablet by mouth daily    [provider]  sulfamethoxazole -trimethoprim  (BACTRIM  DS,SEPTRA  DS) 800-160 MG tablet Take 1 tablet by mouth every 12 (twelve) hours. Patient not taking: Reported on 05/08/2017 11/23/16   Odell Celinda Balo, MD  traZODone  (DESYREL ) 50 MG tablet Take 1 tablet by mouth at bedtime    [provider]                                                                                                                                    Allergies Lamotrigine  Review of Systems Review of Systems As noted in HPI  Physical Exam Vital Signs  I have reviewed the triage vital signs BP 106/79 (BP Location:  Left Arm)   Pulse 67   Temp 98.3 F (36.8 C) (Oral)   Resp 20   Ht 5' 10 (1.778 m)   Wt 59 kg   SpO2 99%   BMI 18.65 kg/m   Physical Exam Vitals reviewed.  Constitutional:      General: She is not in acute distress.    Appearance: She is well-developed. She is not diaphoretic.  HENT:     Head: Normocephalic and atraumatic.     Right Ear: External ear normal.     Left Ear: External ear normal.     Nose: Nose normal.  Eyes:     General: No scleral icterus.    Conjunctiva/sclera: Conjunctivae normal.  Neck:     Trachea: Phonation normal.  Cardiovascular:     Rate and Rhythm: Normal rate and regular rhythm.  Pulmonary:     Effort: Pulmonary effort is normal. No respiratory distress.     Breath sounds: No stridor.  Abdominal:     General: There is no distension.  Musculoskeletal:        General: Normal range of motion.     Cervical back: Normal range of motion.  Neurological:     Mental Status: She is alert and oriented to person, place, and time.  Psychiatric:        Behavior: Behavior normal.     ED Results and Treatments Labs (all labs ordered are listed, but only abnormal results are  displayed) Labs Reviewed - No data to display                                                                                                                       EKG  EKG Interpretation Date/Time:    Ventricular Rate:    PR Interval:    QRS Duration:    QT Interval:    QTC Calculation:   R Axis:      Text Interpretation:         Radiology No results found.  Medications Ordered in ED Medications - No data to display Procedures Procedures  (including critical care time) Medical Decision Making / ED Course   Medical Decision Making   Confirm patient's dose of Abilify through the bottle.  She does have active prescription for Abilify with 1 refill left. I recommended looking for online prescription medicine coupons which we did at bedside and found Abilify for less than $30 at [facility].  Patient was given the coupon.  She will go to the 24-hour pharmacy to pick up her medication.  Resources given.    Final Clinical Impression(s) / ED Diagnoses Final diagnoses:  Encounter for medication refill   The patient appears reasonably screened and/or stabilized for discharge and I doubt any other medical condition or other [facility] requiring further screening, evaluation, or treatment in the ED at this time. I have discussed the findings, Dx and Tx plan with the patient/family who expressed understanding and agree(s) with the plan. Discharge instructions discussed at length. The  patient/family was given strict return precautions who verbalized understanding of the instructions. No further questions at time of discharge.  Disposition: Discharge  Condition: Good  ED Discharge Orders     None        Follow Up: Primary care provider  Call  to schedule an appointment for close follow up  [facility] Address: [address] [place], [place] [address] Hours: Open 24 hours Monday through Sunday Phone: [phone] Go to  as needed     This chart was  dictated using voice recognition software.  Despite best efforts to proofread,  errors can occur which can change the documentation meaning.    Trine Raynell Moder, MD 10/14/23 [phone]

## 2023-10-14 NOTE — ED Notes (Signed)
 RN reviewed discharge instructions with pt. Pt verbalized understanding and had no further questions. VSS upon discharge.

## 2023-10-21 DIAGNOSIS — Z419 Encounter for procedure for purposes other than remedying health state, unspecified: Secondary | ICD-10-CM | POA: Diagnosis not present

## 2023-10-22 DIAGNOSIS — F319 Bipolar disorder, unspecified: Secondary | ICD-10-CM | POA: Diagnosis not present

## 2023-10-22 DIAGNOSIS — F4312 Post-traumatic stress disorder, chronic: Secondary | ICD-10-CM | POA: Diagnosis not present

## 2023-10-22 DIAGNOSIS — F902 Attention-deficit hyperactivity disorder, combined type: Secondary | ICD-10-CM | POA: Diagnosis not present

## 2023-11-12 DIAGNOSIS — F319 Bipolar disorder, unspecified: Secondary | ICD-10-CM | POA: Diagnosis not present

## 2023-11-12 DIAGNOSIS — F902 Attention-deficit hyperactivity disorder, combined type: Secondary | ICD-10-CM | POA: Diagnosis not present

## 2023-11-12 DIAGNOSIS — F4312 Post-traumatic stress disorder, chronic: Secondary | ICD-10-CM | POA: Diagnosis not present

## 2023-11-21 DIAGNOSIS — Z419 Encounter for procedure for purposes other than remedying health state, unspecified: Secondary | ICD-10-CM | POA: Diagnosis not present

## 2023-12-10 DIAGNOSIS — F319 Bipolar disorder, unspecified: Secondary | ICD-10-CM | POA: Diagnosis not present

## 2023-12-10 DIAGNOSIS — F902 Attention-deficit hyperactivity disorder, combined type: Secondary | ICD-10-CM | POA: Diagnosis not present

## 2023-12-10 DIAGNOSIS — F4312 Post-traumatic stress disorder, chronic: Secondary | ICD-10-CM | POA: Diagnosis not present

## 2024-01-07 DIAGNOSIS — F902 Attention-deficit hyperactivity disorder, combined type: Secondary | ICD-10-CM | POA: Diagnosis not present

## 2024-01-07 DIAGNOSIS — F319 Bipolar disorder, unspecified: Secondary | ICD-10-CM | POA: Diagnosis not present

## 2024-01-07 DIAGNOSIS — F4312 Post-traumatic stress disorder, chronic: Secondary | ICD-10-CM | POA: Diagnosis not present

## 2024-01-15 DIAGNOSIS — Z6821 Body mass index (BMI) 21.0-21.9, adult: Secondary | ICD-10-CM | POA: Diagnosis not present

## 2024-01-15 DIAGNOSIS — F902 Attention-deficit hyperactivity disorder, combined type: Secondary | ICD-10-CM | POA: Diagnosis not present

## 2024-01-15 DIAGNOSIS — F1411 Cocaine abuse, in remission: Secondary | ICD-10-CM | POA: Diagnosis not present

## 2024-01-15 DIAGNOSIS — F1011 Alcohol abuse, in remission: Secondary | ICD-10-CM | POA: Diagnosis not present

## 2024-01-15 DIAGNOSIS — F4312 Post-traumatic stress disorder, chronic: Secondary | ICD-10-CM | POA: Diagnosis not present

## 2024-01-15 DIAGNOSIS — F319 Bipolar disorder, unspecified: Secondary | ICD-10-CM | POA: Diagnosis not present

## 2024-01-15 DIAGNOSIS — L03019 Cellulitis of unspecified finger: Secondary | ICD-10-CM | POA: Diagnosis not present

## 2024-01-21 DIAGNOSIS — Z419 Encounter for procedure for purposes other than remedying health state, unspecified: Secondary | ICD-10-CM | POA: Diagnosis not present

## 2024-02-04 DIAGNOSIS — F4310 Post-traumatic stress disorder, unspecified: Secondary | ICD-10-CM | POA: Diagnosis not present

## 2024-02-20 DIAGNOSIS — Z419 Encounter for procedure for purposes other than remedying health state, unspecified: Secondary | ICD-10-CM | POA: Diagnosis not present

## 2024-02-25 DIAGNOSIS — F4312 Post-traumatic stress disorder, chronic: Secondary | ICD-10-CM | POA: Diagnosis not present

## 2024-02-25 DIAGNOSIS — F319 Bipolar disorder, unspecified: Secondary | ICD-10-CM | POA: Diagnosis not present

## 2024-02-25 DIAGNOSIS — F902 Attention-deficit hyperactivity disorder, combined type: Secondary | ICD-10-CM | POA: Diagnosis not present
